# Patient Record
Sex: Male | Born: 1965
Health system: Southern US, Community
[De-identification: ages and names within clinical notes are randomized; demographics above are authoritative.]

## PROBLEM LIST (undated history)

## (undated) DIAGNOSIS — E785 Hyperlipidemia, unspecified: Secondary | ICD-10-CM

## (undated) DIAGNOSIS — I10 Essential (primary) hypertension: Secondary | ICD-10-CM

## (undated) DIAGNOSIS — R569 Unspecified convulsions: Secondary | ICD-10-CM

## (undated) HISTORY — PX: KNEE SURGERY: SHX244

## (undated) HISTORY — DX: Unspecified convulsions: R56.9

## (undated) HISTORY — DX: Hyperlipidemia, unspecified: E78.5

## (undated) HISTORY — DX: Essential (primary) hypertension: I10

---

## 2005-07-11 DIAGNOSIS — I10 Essential (primary) hypertension: Secondary | ICD-10-CM | POA: Insufficient documentation

## 2008-01-31 DIAGNOSIS — E785 Hyperlipidemia, unspecified: Secondary | ICD-10-CM | POA: Insufficient documentation

## 2010-05-08 LAB — HEPATIC FUNCTION PANEL
ALT: 14 U/L (ref 10–40)
AST: 18 U/L (ref 14–40)

## 2010-06-28 ENCOUNTER — Emergency Department: Payer: Self-pay | Admitting: Emergency Medicine

## 2011-04-14 ENCOUNTER — Emergency Department: Payer: Self-pay | Admitting: *Deleted

## 2014-03-01 LAB — LIPID PANEL
Cholesterol: 169 mg/dL (ref 0–200)
HDL: 41 mg/dL (ref 35–70)
LDL CALC: 121 mg/dL
TRIGLYCERIDES: 33 mg/dL — AB (ref 40–160)

## 2014-03-01 LAB — BASIC METABOLIC PANEL
BUN: 12 mg/dL (ref 4–21)
Creatinine: 1 mg/dL (ref 0.6–1.3)
GLUCOSE: 97 mg/dL
POTASSIUM: 4.5 mmol/L (ref 3.4–5.3)
SODIUM: 141 mmol/L (ref 137–147)

## 2014-04-14 DIAGNOSIS — G4733 Obstructive sleep apnea (adult) (pediatric): Secondary | ICD-10-CM | POA: Insufficient documentation

## 2014-04-14 DIAGNOSIS — G40909 Epilepsy, unspecified, not intractable, without status epilepticus: Secondary | ICD-10-CM | POA: Insufficient documentation

## 2015-02-01 ENCOUNTER — Other Ambulatory Visit: Payer: Self-pay | Admitting: Family Medicine

## 2015-02-01 DIAGNOSIS — I10 Essential (primary) hypertension: Secondary | ICD-10-CM

## 2015-02-01 MED ORDER — HYDROCHLOROTHIAZIDE 12.5 MG PO CAPS: 12.5000 mg | ORAL_CAPSULE | Freq: Every day | ORAL | Status: DC

## 2015-02-01 MED ORDER — LISINOPRIL 20 MG PO TABS: 20.0000 mg | ORAL_TABLET | Freq: Two times a day (BID) | ORAL | Status: DC

## 2015-08-09 ENCOUNTER — Other Ambulatory Visit: Payer: Self-pay | Admitting: Family Medicine

## 2015-08-09 DIAGNOSIS — I10 Essential (primary) hypertension: Secondary | ICD-10-CM

## 2015-08-09 MED ORDER — HYDROCHLOROTHIAZIDE 12.5 MG PO CAPS: 12.5000 mg | ORAL_CAPSULE | Freq: Every day | ORAL | Status: DC

## 2015-08-09 MED ORDER — LISINOPRIL 20 MG PO TABS: 20.0000 mg | ORAL_TABLET | Freq: Two times a day (BID) | ORAL | Status: DC

## 2016-02-04 ENCOUNTER — Other Ambulatory Visit: Payer: Self-pay | Admitting: Family Medicine

## 2016-02-04 DIAGNOSIS — I10 Essential (primary) hypertension: Secondary | ICD-10-CM

## 2016-02-04 MED ORDER — HYDROCHLOROTHIAZIDE 12.5 MG PO CAPS: 12.5000 mg | ORAL_CAPSULE | Freq: Every day | ORAL | Status: DC

## 2016-02-07 ENCOUNTER — Other Ambulatory Visit: Payer: Self-pay | Admitting: Family Medicine

## 2016-02-07 DIAGNOSIS — I10 Essential (primary) hypertension: Secondary | ICD-10-CM

## 2016-02-07 MED ORDER — LISINOPRIL 20 MG PO TABS: 20.0000 mg | ORAL_TABLET | Freq: Two times a day (BID) | ORAL | Status: DC

## 2016-02-27 ENCOUNTER — Ambulatory Visit (INDEPENDENT_AMBULATORY_CARE_PROVIDER_SITE_OTHER): Payer: BLUE CROSS/BLUE SHIELD | Admitting: Family Medicine

## 2016-02-27 ENCOUNTER — Encounter: Payer: Self-pay | Admitting: Family Medicine

## 2016-02-27 VITALS — BP 112/64 | HR 74 | Temp 97.9°F | Resp 16 | Ht 71.0 in | Wt 285.8 lb

## 2016-02-27 DIAGNOSIS — G40909 Epilepsy, unspecified, not intractable, without status epilepticus: Secondary | ICD-10-CM | POA: Diagnosis not present

## 2016-02-27 DIAGNOSIS — Z0000 Encounter for general adult medical examination without abnormal findings: Secondary | ICD-10-CM | POA: Diagnosis not present

## 2016-02-27 DIAGNOSIS — E785 Hyperlipidemia, unspecified: Secondary | ICD-10-CM | POA: Diagnosis not present

## 2016-02-27 DIAGNOSIS — Z23 Encounter for immunization: Secondary | ICD-10-CM | POA: Diagnosis not present

## 2016-02-27 DIAGNOSIS — I10 Essential (primary) hypertension: Secondary | ICD-10-CM | POA: Diagnosis not present

## 2016-02-27 NOTE — Patient Instructions (Signed)
We will call you with the lab results. Encourage walking 5 days a week for at least 30 minutes.

## 2016-02-27 NOTE — Progress Notes (Signed)
Subjective:     Patient ID: Cristian AnaBrice Willmore Jr., male   DOB: 07-Mar-1966, 50 y.o.   MRN: [id number]  HPI  Chief Complaint  Patient presents with  . Annual Exam    Patient comes in office today for his annual physical he states that he has no questions or concerns today. Patient reports that he is eating a well balanced diet and sleeping well,  actively exercising 3x a week.   States he will walk for 30-45 minutes. 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   Review of Systems General: Feeling well, due for tetanus booster. HEENT: pending eye exam for evaluation of left lateral eye lesion. Has not seen a dentist in two years but encouraged to do so. Cardiovascular: no chest pain, shortness of breath, or palpitations GI: no heartburn, no change in bowel habits or blood in the stool GU: nocturia x0- 1, no change in bladder habits  Neuro: sees neurology, Dr. Sherryll BurgerShah, annually for medication management Psychiatric: not depressed Musculoskeletal: no joint pain    Objective:   Physical Exam  Constitutional: He appears well-developed and well-nourished. No distress.  Eyes: PERRLA Neck: no thyromegaly, tenderness or nodules,  ENT: TM's intact without inflammation; No tonsillar enlargement or exudate, Lungs: Clear Heart : RRR without murmur or gallop Abd: bowel sounds present, soft, non-tender, no organomegaly Extremities: no edema, no varicosities Skin: 1 cm skin tag to right lateral eye.     Assessment:    1. Annual physical exam  2. Benign essential HTN - Comprehensive metabolic panel  3. HLD (hyperlipidemia) - Lipid panel  4. Seizure disorder (HCC)  5. Need for diphtheria-tetanus-pertussis (Tdap) vaccine - Tdap vaccine greater than or equal to 7yo IM    Plan:    Further f/u pending lab work. Encouraged increasing walking to 5 days/week.

## 2016-02-28 ENCOUNTER — Telehealth: Payer: Self-pay

## 2016-02-28 LAB — COMPREHENSIVE METABOLIC PANEL
ALK PHOS: 65 IU/L (ref 39–117)
ALT: 21 IU/L (ref 0–44)
AST: 26 IU/L (ref 0–40)
Albumin/Globulin Ratio: 1.6 (ref 1.2–2.2)
Albumin: 4.6 g/dL (ref 3.5–5.5)
BUN/Creatinine Ratio: 16 (ref 9–20)
BUN: 14 mg/dL (ref 6–24)
Bilirubin Total: 0.2 mg/dL (ref 0.0–1.2)
CHLORIDE: 97 mmol/L (ref 96–106)
CO2: 23 mmol/L (ref 18–29)
CREATININE: 0.89 mg/dL (ref 0.76–1.27)
Calcium: 9.5 mg/dL (ref 8.7–10.2)
GFR calc Af Amer: 118 mL/min/{1.73_m2} (ref >59)
GFR calc non Af Amer: 102 mL/min/{1.73_m2} (ref >59)
GLUCOSE: 107 mg/dL — AB (ref 65–99)
Globulin, Total: 2.9 g/dL (ref 1.5–4.5)
Potassium: 4.2 mmol/L (ref 3.5–5.2)
SODIUM: 138 mmol/L (ref 134–144)
Total Protein: 7.5 g/dL (ref 6.0–8.5)

## 2016-02-28 LAB — LIPID PANEL
CHOLESTEROL TOTAL: 201 mg/dL — AB (ref 100–199)
Chol/HDL Ratio: 3.9 ratio units (ref 0.0–5.0)
HDL: 51 mg/dL (ref >39)
LDL CALC: 142 mg/dL — AB (ref 0–99)
TRIGLYCERIDES: 42 mg/dL (ref 0–149)
VLDL Cholesterol Cal: 8 mg/dL (ref 5–40)

## 2016-02-28 NOTE — Telephone Encounter (Signed)
-----   Message from Anola Gurneyobert Chauvin, [place] sent at 02/28/2016  7:42 AM EDT ----- Mild sugar and cholesterol elevation. Sugar should improve with increased exercise. Your calculated 10 year risk for developing cardiovascular disease is 5.9%. This is below the 7.5% risk when we recommend a cholesterol lowering medication.

## 2016-02-28 NOTE — Telephone Encounter (Signed)
Patient has been advised of lab report. KW 

## 2016-04-29 ENCOUNTER — Other Ambulatory Visit: Payer: Self-pay

## 2016-04-29 DIAGNOSIS — I10 Essential (primary) hypertension: Secondary | ICD-10-CM

## 2016-04-29 MED ORDER — LISINOPRIL 20 MG PO TABS: 20.0000 mg | ORAL_TABLET | Freq: Two times a day (BID) | ORAL | 0 refills | Status: DC

## 2016-04-29 MED ORDER — HYDROCHLOROTHIAZIDE 12.5 MG PO CAPS: 12.5000 mg | ORAL_CAPSULE | Freq: Every day | ORAL | 0 refills | Status: DC

## 2016-04-29 NOTE — Telephone Encounter (Addendum)
Cristian Ortiz patient:: [facility] requesting HCTZ 12.5 mg and Lisinopril 20 mg- 90 day supply.

## 2016-08-05 ENCOUNTER — Other Ambulatory Visit: Payer: Self-pay | Admitting: Family Medicine

## 2016-08-05 DIAGNOSIS — I10 Essential (primary) hypertension: Secondary | ICD-10-CM

## 2017-01-28 ENCOUNTER — Other Ambulatory Visit: Payer: Self-pay | Admitting: Family Medicine

## 2017-01-28 DIAGNOSIS — I10 Essential (primary) hypertension: Secondary | ICD-10-CM

## 2017-04-23 ENCOUNTER — Other Ambulatory Visit: Payer: Self-pay | Admitting: Family Medicine

## 2017-04-23 DIAGNOSIS — I10 Essential (primary) hypertension: Secondary | ICD-10-CM

## 2017-05-05 ENCOUNTER — Other Ambulatory Visit: Payer: Self-pay | Admitting: Family Medicine

## 2017-05-05 DIAGNOSIS — I10 Essential (primary) hypertension: Secondary | ICD-10-CM

## 2017-07-30 ENCOUNTER — Other Ambulatory Visit: Payer: Self-pay | Admitting: Family Medicine

## 2017-07-30 DIAGNOSIS — I10 Essential (primary) hypertension: Secondary | ICD-10-CM

## 2017-11-02 ENCOUNTER — Other Ambulatory Visit: Payer: Self-pay | Admitting: Family Medicine

## 2017-11-02 DIAGNOSIS — I10 Essential (primary) hypertension: Secondary | ICD-10-CM

## 2018-02-01 ENCOUNTER — Other Ambulatory Visit: Payer: Self-pay | Admitting: Family Medicine

## 2018-02-01 DIAGNOSIS — I10 Essential (primary) hypertension: Secondary | ICD-10-CM

## 2018-05-06 ENCOUNTER — Other Ambulatory Visit: Payer: Self-pay | Admitting: Family Medicine

## 2018-05-06 DIAGNOSIS — I10 Essential (primary) hypertension: Secondary | ICD-10-CM

## 2018-05-06 NOTE — Telephone Encounter (Signed)
Patient is overdue for follow up visit. Medication last filled 02/02/18, last office visit 02/27/16. KW

## 2018-05-17 ENCOUNTER — Other Ambulatory Visit: Payer: Self-pay | Admitting: Family Medicine

## 2018-05-17 DIAGNOSIS — I10 Essential (primary) hypertension: Secondary | ICD-10-CM

## 2018-05-24 ENCOUNTER — Encounter: Payer: Self-pay | Admitting: Family Medicine

## 2018-05-24 ENCOUNTER — Ambulatory Visit (INDEPENDENT_AMBULATORY_CARE_PROVIDER_SITE_OTHER): Payer: Self-pay | Admitting: Family Medicine

## 2018-05-24 VITALS — BP 148/88 | HR 70 | Temp 98.7°F | Wt 293.4 lb

## 2018-05-24 DIAGNOSIS — Z23 Encounter for immunization: Secondary | ICD-10-CM

## 2018-05-24 DIAGNOSIS — G40909 Epilepsy, unspecified, not intractable, without status epilepticus: Secondary | ICD-10-CM

## 2018-05-24 DIAGNOSIS — I10 Essential (primary) hypertension: Secondary | ICD-10-CM

## 2018-05-24 DIAGNOSIS — E782 Mixed hyperlipidemia: Secondary | ICD-10-CM

## 2018-05-24 DIAGNOSIS — Z0000 Encounter for general adult medical examination without abnormal findings: Secondary | ICD-10-CM

## 2018-05-24 MED ORDER — HYDROCHLOROTHIAZIDE 12.5 MG PO CAPS: 12.5000 mg | ORAL_CAPSULE | Freq: Every day | ORAL | 3 refills | Status: DC

## 2018-05-24 MED ORDER — LISINOPRIL 20 MG PO TABS: 20.0000 mg | ORAL_TABLET | Freq: Two times a day (BID) | ORAL | 3 refills | Status: DC

## 2018-05-24 NOTE — Progress Notes (Signed)
  Subjective:     Patient ID: Cristian Cal., male   DOB: [birth year], 52 y.o.   MRN: [id number] Chief Complaint  Patient presents with  . Annual Exam    Patient presents today for his annual exam. Patient states he is feeling and sleeping well. Patient states he has not had any seizures lately.   Marland Kitchen HPI Followed up with his neurologist, Dr. Manuella Ghazi, earlier in the year and had Met C performed. 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 Reports he did not take his bp medication this AM. States he walks 30 minutes daily. Currently has no health insurance. Defers colonoscopy to 2020.  Review of Systems General: Feeling well; Wishes flu vaccine today. HEENT:  No regular dental visits but intends to upate. Uses otc readers but intends to update his eye exam. Denies changes in hearing. Cardiovascular: no chest pain, shortness of breath, or palpitations GI: no heartburn, no change in bowel habits or blood in the stool GU: nocturia x 1, no change in bladder habits  Psychiatric: not depressed Musculoskeletal: no joint pain    Objective:   Physical Exam  Constitutional: He appears well-developed and well-nourished. No distress.  Eyes: PERRLA, EOMI Neck: no thyromegaly, tenderness or nodules, no cervical adenopathy or carotid bruits ENT: Ear canals with excessive cerumen and TM"s not visualized.  No tonsillar enlargement or exudate, Lungs: Clear Heart : RRR without murmur or gallop Abd: bowel sounds present, soft, non-tender, no organomegaly Extremities: no edema     Assessment:    1. Annual physical exam - PSA  2. Mixed hyperlipidemia - Lipid panel  3. Seizure disorder [facility]): per neuro for nocturnal seizures  4. Need for influenza vaccination  5. Essential hypertension - hydrochlorothiazide (MICROZIDE) 12.5 MG capsule; Take 1 capsule (12.5 mg total) by mouth daily.  Dispense: 90 capsule; Refill: 3 - lisinopril (PRINIVIL,ZESTRIL) 20 MG tablet; Take 1 tablet (20 mg total) by mouth 2 (two)  times daily.  Dispense: 180 tablet; Refill: 3    Plan:    Further f/u pending lab work. Encouraged daily walking program and updating dental and eye exam. He will call next year for colonoscopy referral.

## 2018-05-24 NOTE — Patient Instructions (Addendum)
We will call you with the lab results. Continue walking at least 30 minutes daily. Call me when you are ready for me to set up the colonoscopy referral. Think about updating your dentist and eye doctor visits.

## 2018-05-24 NOTE — Addendum Note (Signed)
Addended by: Jeanene Erb on: 05/24/2018 09:49 AM   Modules accepted: Orders

## 2018-05-25 ENCOUNTER — Telehealth: Payer: Self-pay

## 2018-05-25 LAB — LIPID PANEL
Chol/HDL Ratio: 4 ratio (ref 0.0–5.0)
Cholesterol, Total: 189 mg/dL (ref 100–199)
HDL: 47 mg/dL (ref >39)
LDL Calculated: 133 mg/dL — ABNORMAL HIGH (ref 0–99)
TRIGLYCERIDES: 43 mg/dL (ref 0–149)
VLDL CHOLESTEROL CAL: 9 mg/dL (ref 5–40)

## 2018-05-25 LAB — PSA: PROSTATE SPECIFIC AG, SERUM: 0.4 ng/mL (ref 0.0–4.0)

## 2018-05-25 NOTE — Telephone Encounter (Signed)
-----   Message from Anola Gurney, [place] sent at 05/25/2018  7:17 AM EST ----- Labs ok. Cholesterol improved since 2017.

## 2018-05-25 NOTE — Telephone Encounter (Signed)
Patient advised.KW 

## 2018-06-16 ENCOUNTER — Telehealth: Payer: Self-pay | Admitting: Family Medicine

## 2018-06-16 NOTE — Telephone Encounter (Signed)
Pt is requesting to pick up copy of immunization record for his employer. Please advise. Thanks TNP

## 2018-06-16 NOTE — Telephone Encounter (Signed)
Patient advised report at front for pick up. KW

## 2018-11-24 MED FILL — PHENYTOIN SOD EXT 200 MG CA: 200 | 90 days supply | Qty: 180 | Fill #0

## 2018-12-15 MED FILL — HYDROCHLOROTHIAZIDE 12.5 MG: 12.5 | 90 days supply | Qty: 90 | Fill #0

## 2018-12-15 MED FILL — LISINOPRIL 20 MG TABLET: 20 | 90 days supply | Qty: 180 | Fill #0

## 2019-02-23 MED FILL — PHENYTOIN SOD EXT 200 MG CA: 200 | 90 days supply | Qty: 180 | Fill #1

## 2019-02-23 MED FILL — TOPIRAMATE 100 MG TABLET: 100 | 90 days supply | Qty: 180 | Fill #0

## 2019-03-18 ENCOUNTER — Other Ambulatory Visit: Payer: Self-pay | Admitting: Physician Assistant

## 2019-03-18 DIAGNOSIS — I10 Essential (primary) hypertension: Secondary | ICD-10-CM

## 2019-03-18 MED FILL — HYDROCHLOROTHIAZIDE 12.5 MG: 12.5 | 60 days supply | Qty: 60 | Fill #1

## 2019-03-18 NOTE — Telephone Encounter (Addendum)
[facility] faxed refill request for the following medications:  1. hydrochlorothiazide (MICROZIDE) 12.5 MG capsule  2. lisinopril (PRINIVIL,ZESTRIL) 20 MG tablet  90 day supply  LOV: 05/24/2018 with Mikki Santee. Pt currently doesn't have an appt to est care with another provider in the office. Please advise. Thanks TNP

## 2019-03-21 ENCOUNTER — Other Ambulatory Visit: Payer: Self-pay

## 2019-03-21 DIAGNOSIS — I10 Essential (primary) hypertension: Secondary | ICD-10-CM

## 2019-03-21 MED ORDER — HYDROCHLOROTHIAZIDE 12.5 MG PO CAPS: 12.5000 mg | ORAL_CAPSULE | Freq: Every day | ORAL | 0 refills | Status: DC

## 2019-03-21 MED ORDER — LISINOPRIL 20 MG PO TABS: 20.0000 mg | ORAL_TABLET | Freq: Two times a day (BID) | ORAL | 0 refills | Status: DC

## 2019-03-21 MED FILL — LISINOPRIL 20 MG TABLET: 20 | 90 days supply | Qty: 180 | Fill #0

## 2019-03-21 NOTE — Telephone Encounter (Signed)
Patient scheduled an appointment for next week. He only has 3 days worth of medication left. Please advise

## 2019-03-21 NOTE — Telephone Encounter (Signed)
Called patient to schedule appointment with another provider in the office but no answer. [place] so can schedule appointment to be seen in office.Please advised.

## 2019-03-29 NOTE — Telephone Encounter (Signed)
Patient has appointment schedule for 04/01/2019.

## 2019-04-01 ENCOUNTER — Other Ambulatory Visit: Payer: Self-pay

## 2019-04-01 ENCOUNTER — Ambulatory Visit (INDEPENDENT_AMBULATORY_CARE_PROVIDER_SITE_OTHER): Payer: No Typology Code available for payment source | Admitting: Physician Assistant

## 2019-04-01 ENCOUNTER — Encounter: Payer: Self-pay | Admitting: Physician Assistant

## 2019-04-01 VITALS — BP 112/78 | HR 68 | Temp 97.1°F | Resp 16 | Ht 71.0 in | Wt 281.6 lb

## 2019-04-01 DIAGNOSIS — Z23 Encounter for immunization: Secondary | ICD-10-CM

## 2019-04-01 DIAGNOSIS — Z1211 Encounter for screening for malignant neoplasm of colon: Secondary | ICD-10-CM | POA: Diagnosis not present

## 2019-04-01 DIAGNOSIS — I10 Essential (primary) hypertension: Secondary | ICD-10-CM | POA: Diagnosis not present

## 2019-04-01 DIAGNOSIS — Z125 Encounter for screening for malignant neoplasm of prostate: Secondary | ICD-10-CM

## 2019-04-01 DIAGNOSIS — G40909 Epilepsy, unspecified, not intractable, without status epilepticus: Secondary | ICD-10-CM

## 2019-04-01 DIAGNOSIS — Z114 Encounter for screening for human immunodeficiency virus [HIV]: Secondary | ICD-10-CM

## 2019-04-01 NOTE — Progress Notes (Signed)
Patient: Cristian AnaBrice Saad Jr. Male    DOB: 12-04-1965   53 y.o.   MRN: [id number] Visit Date: 04/01/2019  Today's Provider: Trey SailorsAdriana M Alaysia Lightle, PA-C   Chief Complaint  Patient presents with  . Hypertension   Subjective:    Living In [place] with wife of 13 years. No children. Two cats. 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   [facility].   HPI  Hypertension, follow-up:  BP Readings from Last 3 Encounters:  04/01/19 112/78  05/24/18 (!) 148/88  02/27/16 112/64    He was last seen for hypertension 10 months ago.  BP at that visit was 148/88. Management changes since that visit include no changes. He reports excellent compliance with treatment. He is not having side effects. He is exercising. He is adherent to low sat diet.   Outside blood pressures are stable. He is experiencing none.  Patient denies chest pain and lower extremity edema.   Cardiovascular risk factors include hypertension and obesity (BMI >= 30 kg/m2).  Use of agents associated with hypertension: none.     Weight trend: stable Wt Readings from Last 3 Encounters:  04/01/19 281 lb 9.6 oz (127.7 kg)  05/24/18 293 lb 6.4 oz (133.1 kg)  02/27/16 285 lb 12.8 oz (129.6 kg)    Current diet: not asked  Colon Cancer Screening: never before, no family history.  ------------------------------------------------------------------------   No Known Allergies   Current Outpatient Medications:  .  folic acid (FOLVITE) 1 MG tablet, Take by mouth., Disp: , Rfl:  .  hydrochlorothiazide (MICROZIDE) 12.5 MG capsule, Take 1 capsule (12.5 mg total) by mouth daily., Disp: 90 capsule, Rfl: 0 .  lisinopril (ZESTRIL) 20 MG tablet, Take 1 tablet (20 mg total) by mouth 2 (two) times daily., Disp: 180 tablet, Rfl: 0 .  LORazepam (ATIVAN) 2 MG tablet, Take 2 mg by mouth daily as needed for anxiety or seizure., Disp: , Rfl:  .  phenytoin  (DILANTIN) 200 MG ER capsule, Take 200 mg by mouth 2 (two) times daily., Disp: , Rfl:  .  topiramate (TOPAMAX) 100 MG tablet, Take 100 mg by mouth., Disp: , Rfl:   Review of Systems  Social History   Tobacco Use  . Smoking status: Never Smoker  . Smokeless tobacco: Never Used  Substance Use Topics  . Alcohol use: No      Objective:   BP 112/78 (BP Location: Left Arm, Patient Position: Sitting, Cuff Size: Normal)   Pulse 68   Temp (!) 97.1 F (36.2 C) (Temporal)   Resp 16   Ht 5\' 11"  (1.803 m)   Wt 281 lb 9.6 oz (127.7 kg)   SpO2 97%   BMI 39.28 kg/m  Vitals:   04/01/19 1329  BP: 112/78  Pulse: 68  Resp: 16  Temp: (!) 97.1 F (36.2 C)  TempSrc: Temporal  SpO2: 97%  Weight: 281 lb 9.6 oz (127.7 kg)  Height: 5\' 11"  (1.803 m)  Body mass index is 39.28 kg/m.   Physical Exam Constitutional:      Appearance: Normal appearance.  Cardiovascular:     Rate and Rhythm: Normal rate and regular rhythm.     Heart sounds: Normal heart sounds.  Pulmonary:     Effort: Pulmonary effort is normal.     Breath sounds: Normal breath sounds.  Skin:    General: Skin is warm and dry.  Neurological:  Mental Status: He is alert and oriented to person, place, and time. Mental status is at baseline.  Psychiatric:        Mood and Affect: Mood normal.        Behavior: Behavior normal.      No results found for any visits on 04/01/19.     Assessment & Plan    1. Benign essential HTN  Well controlled, continue current medications.  - Comprehensive Metabolic Panel (CMET) - Lipid Profile - CBC with Differential  2. Need for influenza vaccination  - Flu Vaccine QUAD 36+ mos IM  3. Colon cancer screening   4. Prostate cancer screening  - PSA  5. Encounter for screening for HIV  - HIV antibody (with reflex)  The entirety of the information documented in the History of Present Illness, Review of Systems and Physical Exam were personally obtained by me. Portions of  this information were initially documented by Lynford Humphrey, CMA and reviewed by me for thoroughness and accuracy.       Trinna Post, PA-C  [facility]

## 2019-04-01 NOTE — Patient Instructions (Signed)

## 2019-04-02 LAB — CBC WITH DIFFERENTIAL/PLATELET
Basophils Absolute: 0.1 10*3/uL (ref 0.0–0.2)
Basos: 1 %
EOS (ABSOLUTE): 0.4 10*3/uL (ref 0.0–0.4)
Eos: 4 %
Hematocrit: 41.6 % (ref 37.5–51.0)
Hemoglobin: 14.3 g/dL (ref 13.0–17.7)
Immature Grans (Abs): 0 10*3/uL (ref 0.0–0.1)
Immature Granulocytes: 0 %
Lymphocytes Absolute: 1.4 10*3/uL (ref 0.7–3.1)
Lymphs: 14 %
MCH: 29.5 pg (ref 26.6–33.0)
MCHC: 34.4 g/dL (ref 31.5–35.7)
MCV: 86 fL (ref 79–97)
Monocytes Absolute: 0.6 10*3/uL (ref 0.1–0.9)
Monocytes: 7 %
Neutrophils Absolute: 7 10*3/uL (ref 1.4–7.0)
Neutrophils: 74 %
Platelets: 319 10*3/uL (ref 150–450)
RBC: 4.84 x10E6/uL (ref 4.14–5.80)
RDW: 12.5 % (ref 11.6–15.4)
WBC: 9.4 10*3/uL (ref 3.4–10.8)

## 2019-04-02 LAB — PSA: Prostate Specific Ag, Serum: 0.5 ng/mL (ref 0.0–4.0)

## 2019-04-02 LAB — COMPREHENSIVE METABOLIC PANEL
ALT: 21 IU/L (ref 0–44)
AST: 28 IU/L (ref 0–40)
Albumin/Globulin Ratio: 1.7 (ref 1.2–2.2)
Albumin: 4.5 g/dL (ref 4.0–5.0)
Alkaline Phosphatase: 93 IU/L (ref 39–117)
BUN/Creatinine Ratio: 13 (ref 9–20)
BUN: 11 mg/dL (ref 6–24)
Bilirubin Total: 0.2 mg/dL (ref 0.0–1.2)
CO2: 22 mmol/L (ref 20–29)
Calcium: 9.1 mg/dL (ref 8.7–10.2)
Chloride: 103 mmol/L (ref 96–106)
Creatinine, Ser: 0.85 mg/dL (ref 0.76–1.27)
GFR calc Af Amer: 117 mL/min/{1.73_m2} (ref >59)
GFR calc non Af Amer: 102 mL/min/{1.73_m2} (ref >59)
Globulin, Total: 2.7 g/dL (ref 1.5–4.5)
Glucose: 84 mg/dL (ref 65–99)
Potassium: 4 mmol/L (ref 3.5–5.2)
Sodium: 137 mmol/L (ref 134–144)
Total Protein: 7.2 g/dL (ref 6.0–8.5)

## 2019-04-02 LAB — LIPID PANEL
Chol/HDL Ratio: 3.6 ratio (ref 0.0–5.0)
Cholesterol, Total: 185 mg/dL (ref 100–199)
HDL: 52 mg/dL (ref >39)
LDL Chol Calc (NIH): 125 mg/dL — ABNORMAL HIGH (ref 0–99)
Triglycerides: 41 mg/dL (ref 0–149)
VLDL Cholesterol Cal: 8 mg/dL (ref 5–40)

## 2019-04-02 LAB — HIV ANTIBODY (ROUTINE TESTING W REFLEX): HIV Screen 4th Generation wRfx: NONREACTIVE

## 2019-05-21 ENCOUNTER — Other Ambulatory Visit: Payer: Self-pay | Admitting: Physician Assistant

## 2019-05-21 DIAGNOSIS — I10 Essential (primary) hypertension: Secondary | ICD-10-CM

## 2019-05-21 MED FILL — TOPIRAMATE 100 MG TABLET: 100 | 90 days supply | Qty: 180 | Fill #1

## 2019-05-21 MED FILL — PHENYTOIN SOD EXT 200 MG CA: 200 | 90 days supply | Qty: 180 | Fill #2

## 2019-05-21 MED FILL — HYDROCHLOROTHIAZIDE 12.5 MG: 12.5 | 90 days supply | Qty: 90 | Fill #0

## 2019-05-23 NOTE — Telephone Encounter (Signed)
L.O.V. was on 04/01/2019.

## 2019-06-21 MED FILL — LISINOPRIL 20 MG TABLET: 20 | 90 days supply | Qty: 180 | Fill #0

## 2019-08-22 ENCOUNTER — Other Ambulatory Visit: Payer: Self-pay | Admitting: Physician Assistant

## 2019-08-22 DIAGNOSIS — I10 Essential (primary) hypertension: Secondary | ICD-10-CM

## 2019-08-22 MED FILL — PHENYTOIN SOD EXT 200 MG CA: 200 | 90 days supply | Qty: 180 | Fill #3

## 2019-08-22 MED FILL — TOPIRAMATE 100 MG TABLET: 100 | 90 days supply | Qty: 180 | Fill #2

## 2019-08-23 ENCOUNTER — Other Ambulatory Visit: Payer: Self-pay | Admitting: Physician Assistant

## 2019-08-23 DIAGNOSIS — I10 Essential (primary) hypertension: Secondary | ICD-10-CM

## 2019-08-23 MED FILL — HYDROCHLOROTHIAZIDE 12.5 MG: 12.5 | 90 days supply | Qty: 90 | Fill #0

## 2019-09-19 ENCOUNTER — Other Ambulatory Visit: Payer: Self-pay | Admitting: Physician Assistant

## 2019-09-19 DIAGNOSIS — I10 Essential (primary) hypertension: Secondary | ICD-10-CM

## 2019-09-19 MED FILL — LISINOPRIL 20 MG TABLET: 20 | 30 days supply | Qty: 60 | Fill #0

## 2019-10-25 ENCOUNTER — Other Ambulatory Visit: Payer: Self-pay

## 2019-10-25 ENCOUNTER — Ambulatory Visit (INDEPENDENT_AMBULATORY_CARE_PROVIDER_SITE_OTHER): Payer: No Typology Code available for payment source | Admitting: Physician Assistant

## 2019-10-25 ENCOUNTER — Encounter: Payer: Self-pay | Admitting: Physician Assistant

## 2019-10-25 VITALS — BP 135/84 | HR 75 | Temp 95.3°F | Wt 294.6 lb

## 2019-10-25 DIAGNOSIS — Z1211 Encounter for screening for malignant neoplasm of colon: Secondary | ICD-10-CM

## 2019-10-25 DIAGNOSIS — G40909 Epilepsy, unspecified, not intractable, without status epilepticus: Secondary | ICD-10-CM

## 2019-10-25 DIAGNOSIS — E782 Mixed hyperlipidemia: Secondary | ICD-10-CM | POA: Diagnosis not present

## 2019-10-25 DIAGNOSIS — I10 Essential (primary) hypertension: Secondary | ICD-10-CM

## 2019-10-25 MED ORDER — HYDROCHLOROTHIAZIDE 12.5 MG PO CAPS: 12.5000 mg | ORAL_CAPSULE | Freq: Every day | ORAL | 1 refills | Status: DC

## 2019-10-25 MED ORDER — LISINOPRIL 20 MG PO TABS: 20.0000 mg | ORAL_TABLET | Freq: Two times a day (BID) | ORAL | 1 refills | Status: DC

## 2019-10-25 MED FILL — LISINOPRIL 20 MG TABLET: 20 | 90 days supply | Qty: 180 | Fill #0

## 2019-10-25 NOTE — Progress Notes (Signed)
Patient: Cristian Ortiz. Male    DOB: 1966-02-07   54 y.o.   MRN: [id number] Visit Date: 10/25/2019  Today's Provider: Trinna Post, PA-C   Chief Complaint  Patient presents with  . Hypertension   Subjective:     HPI  Hypertension, follow-up:  BP Readings from Last 3 Encounters:  10/25/19 135/84  04/01/19 112/78  05/24/18 (!) 148/88    He was last seen for hypertension 7 months ago.  BP at that visit was 112/78. Management changes since that visit include none. Continues HCTZ 12.5 mg QD and lisinopril 20 mg QD>  He reports good compliance with treatment. He is not having side effects.  He is exercising. He is adherent to low salt diet.   Outside blood pressures are normal. He is experiencing none.  Patient denies chest pain, chest pressure/discomfort, exertional chest pressure/discomfort, fatigue, irregular heart beat, lower extremity edema and palpitations.   Cardiovascular risk factors include hypertension, male gender and obesity (BMI >= 30 kg/m2).  Use of agents associated with hypertension: none.     Weight trend: increasing steadily Wt Readings from Last 3 Encounters:  10/25/19 294 lb 9.6 oz (133.6 kg)  04/01/19 281 lb 9.6 oz (127.7 kg)  05/24/18 293 lb 6.4 oz (133.1 kg)    Current diet: in general, a "healthy" diet    ------------------------------------------------------------------------   No Known Allergies   Current Outpatient Medications:  .  folic acid (FOLVITE) 1 MG tablet, Take by mouth., Disp: , Rfl:  .  hydrochlorothiazide (MICROZIDE) 12.5 MG capsule, TAKE 1 CAPSULE BY MOUTH ONCE DAILY, Disp: 90 capsule, Rfl: 0 .  lisinopril (ZESTRIL) 20 MG tablet, TAKE 1 TABLET BY MOUTH TWICE DAILY, Disp: 60 tablet, Rfl: 0 .  LORazepam (ATIVAN) 2 MG tablet, Take 2 mg by mouth daily as needed for anxiety or seizure., Disp: , Rfl:  .  phenytoin (DILANTIN) 200 MG ER capsule, Take 200 mg by mouth 2 (two) times daily., Disp: , Rfl:  .  topiramate  (TOPAMAX) 100 MG tablet, Take 100 mg by mouth., Disp: , Rfl:   Review of Systems  Constitutional: Negative.   Respiratory: Negative.   Cardiovascular: Negative.   Hematological: Negative.     Social History   Tobacco Use  . Smoking status: Never Smoker  . Smokeless tobacco: Never Used  Substance Use Topics  . Alcohol use: No      Objective:   BP 135/84 (BP Location: Right Arm, Patient Position: Sitting, Cuff Size: Normal)   Pulse 75   Temp (!) 95.3 F (35.2 C) (Temporal)   Wt 294 lb 9.6 oz (133.6 kg)   BMI 41.09 kg/m  Vitals:   10/25/19 0954  BP: 135/84  Pulse: 75  Temp: (!) 95.3 F (35.2 C)  TempSrc: Temporal  Weight: 294 lb 9.6 oz (133.6 kg)  Body mass index is 41.09 kg/m.   Physical Exam Constitutional:      Appearance: Normal appearance.  Cardiovascular:     Rate and Rhythm: Normal rate and regular rhythm.     Heart sounds: Normal heart sounds.  Pulmonary:     Effort: Pulmonary effort is normal.     Breath sounds: Normal breath sounds.  Skin:    General: Skin is warm and dry.  Neurological:     Mental Status: He is alert and oriented to person, place, and time. Mental status is at baseline.  Psychiatric:        Mood and Affect: Mood normal.  Behavior: Behavior normal.      No results found for any visits on 10/25/19.     Assessment & Plan    1. Mixed hyperlipidemia  Monitor for now.    2. Benign essential HTN  Continue medications as prescribed. Follow up in 6 months for CPE and f/u. If BP normal at that point, will follow up in one year.   3. Seizure disorder (HCC)  Followed by neurology.  4. Colon cancer screening  - Cologuard  5. Essential hypertension  - lisinopril (ZESTRIL) 20 MG tablet; Take 1 tablet (20 mg total) by mouth 2 (two) times daily.  Dispense: 180 tablet; Refill: 1 - hydrochlorothiazide (MICROZIDE) 12.5 MG capsule; Take 1 capsule (12.5 mg total) by mouth daily.  Dispense: 90 capsule; Refill: 1  The  entirety of the information documented in the History of Present Illness, Review of Systems and Physical Exam were personally obtained by me. Portions of this information were initially documented by [facility] and reviewed by me for thoroughness and accuracy.      Trey Sailors, PA-C  [facility]

## 2019-10-25 NOTE — Patient Instructions (Signed)

## 2019-10-27 LAB — COLOGUARD: Cologuard: NEGATIVE

## 2019-11-01 ENCOUNTER — Telehealth: Payer: Self-pay

## 2019-11-01 DIAGNOSIS — E669 Obesity, unspecified: Secondary | ICD-10-CM | POA: Insufficient documentation

## 2019-11-01 DIAGNOSIS — N62 Hypertrophy of breast: Secondary | ICD-10-CM | POA: Insufficient documentation

## 2019-11-01 DIAGNOSIS — I10 Essential (primary) hypertension: Secondary | ICD-10-CM | POA: Insufficient documentation

## 2019-11-01 DIAGNOSIS — J45909 Unspecified asthma, uncomplicated: Secondary | ICD-10-CM | POA: Insufficient documentation

## 2019-11-01 DIAGNOSIS — L819 Disorder of pigmentation, unspecified: Secondary | ICD-10-CM | POA: Insufficient documentation

## 2019-11-01 LAB — EXTERNAL GENERIC LAB PROCEDURE: COLOGUARD: NEGATIVE

## 2019-11-01 LAB — COLOGUARD: COLOGUARD: NEGATIVE

## 2019-11-01 NOTE — Telephone Encounter (Signed)
Called and LVM for patient about negative cologuard.

## 2019-11-21 MED FILL — TOPIRAMATE 100 MG TABLET: 100 | 90 days supply | Qty: 180 | Fill #0

## 2019-11-21 MED FILL — PHENYTOIN SOD EXT 200 MG CA: 200 | 90 days supply | Qty: 180 | Fill #0

## 2019-11-21 MED FILL — HYDROCHLOROTHIAZIDE 12.5 MG: 12.5 | 90 days supply | Qty: 90 | Fill #0

## 2020-01-17 MED FILL — LISINOPRIL 20 MG TABLET: 20 | 90 days supply | Qty: 180 | Fill #1

## 2020-02-13 MED FILL — HYDROCHLOROTHIAZIDE 12.5 MG: 12.5 | 90 days supply | Qty: 90 | Fill #1

## 2020-02-14 ENCOUNTER — Ambulatory Visit: Payer: Self-pay | Attending: Internal Medicine

## 2020-02-14 DIAGNOSIS — Z23 Encounter for immunization: Secondary | ICD-10-CM

## 2020-02-14 NOTE — Progress Notes (Signed)
   [facility]  Name:  Cristian Ortiz.    MRN: [id number] DOB: [birth year]  02/14/2020  [name] was observed post Covid-19 immunization for 15 minutes without incident. He was provided with Vaccine Information Sheet and instruction to access the V-Safe system.   [name] was instructed to call 911 with any severe reactions post vaccine: Marland Kitchen Difficulty breathing  . Swelling of face and throat  . A fast heartbeat  . A bad rash all over body  . Dizziness and weakness   Immunizations Administered    Name Date Dose VIS Date Route   JANSSEN COVID-19 VACCINE 02/14/2020  2:19 PM 0.5 mL 09/17/2019 Intramuscular   Manufacturer: [employer]wood Dibbles   Lot: [id number]   NDC: 37902-409-73

## 2020-02-22 MED FILL — PHENYTOIN SOD EXT 200 MG CA: 200 | 90 days supply | Qty: 180 | Fill #0

## 2020-02-22 MED FILL — TOPIRAMATE 100 MG TABLET: 100 | 90 days supply | Qty: 180 | Fill #0

## 2020-03-01 ENCOUNTER — Other Ambulatory Visit (HOSPITAL_COMMUNITY): Payer: Self-pay | Admitting: Neurology

## 2020-03-02 MED FILL — LORazepam 2 MG TABS: 2 | 15 days supply | Qty: 15 | Fill #0

## 2020-04-16 ENCOUNTER — Other Ambulatory Visit: Payer: Self-pay | Admitting: Physician Assistant

## 2020-04-16 DIAGNOSIS — I10 Essential (primary) hypertension: Secondary | ICD-10-CM

## 2020-04-16 MED FILL — LISINOPRIL 20 MG TABLET: 20 | 30 days supply | Qty: 60 | Fill #0

## 2020-04-16 NOTE — Telephone Encounter (Signed)
Has appointment scheduled- 30 day RF granted

## 2020-04-25 ENCOUNTER — Other Ambulatory Visit: Payer: Self-pay

## 2020-04-25 ENCOUNTER — Encounter: Payer: Self-pay | Admitting: Physician Assistant

## 2020-04-25 ENCOUNTER — Ambulatory Visit (INDEPENDENT_AMBULATORY_CARE_PROVIDER_SITE_OTHER): Payer: No Typology Code available for payment source | Admitting: Physician Assistant

## 2020-04-25 ENCOUNTER — Other Ambulatory Visit: Payer: Self-pay | Admitting: Physician Assistant

## 2020-04-25 VITALS — BP 122/76 | HR 78 | Temp 98.8°F | Resp 16 | Ht 72.0 in | Wt 282.0 lb

## 2020-04-25 DIAGNOSIS — Z0000 Encounter for general adult medical examination without abnormal findings: Secondary | ICD-10-CM | POA: Diagnosis not present

## 2020-04-25 DIAGNOSIS — Z1159 Encounter for screening for other viral diseases: Secondary | ICD-10-CM

## 2020-04-25 NOTE — Progress Notes (Signed)
Complete physical exam   Patient: Cristian Ortiz.   DOB: [birth year]   53 y.o. Male  MRN: [id number] Visit Date: 04/25/2020  Today's healthcare provider: Trey Sailors, PA-C   Chief Complaint  Patient presents with  . Annual Exam   Subjective    Cristian Corradi. is a 54 y.o. male who presents today for a complete physical exam.  He reports consuming a general diet. The patient does not participate in regular exercise at present. He generally feels fairly well. He reports sleeping well. He does not have additional problems to discuss today.    Cologuard completed 10/27/2019, negative.   Past Medical History:  Diagnosis Date  . Hyperlipidemia   . Hypertension   . Seizures (HCC)    Past Surgical History:  Procedure Laterality Date  . KNEE SURGERY     Social History   Socioeconomic History  . Marital status: Married    Spouse name: Not on file  . Number of children: Not on file  . Years of education: Not on file  . Highest education level: Not on file  Occupational History  . Not on file  Tobacco Use  . Smoking status: Never Smoker  . Smokeless tobacco: Never Used  Substance and Sexual Activity  . Alcohol use: No  . Drug use: No  . Sexual activity: Yes    Partners: Female  Other Topics Concern  . Not on file  Social History Narrative  . Not on file   Social Determinants of Health   Financial Resource Strain:   . Difficulty of Paying Living Expenses: Not on file  Food Insecurity:   . Worried About Programme researcher, broadcasting/film/video in the Last Year: Not on file  . Ran Out of Food in the Last Year: Not on file  Transportation Needs:   . Lack of Transportation (Medical): Not on file  . Lack of Transportation (Non-Medical): Not on file  Physical Activity:   . Days of Exercise per Week: Not on file  . Minutes of Exercise per Session: Not on file  Stress:   . Feeling of Stress : Not on file  Social Connections:   . Frequency of Communication with Friends and  Family: Not on file  . Frequency of Social Gatherings with Friends and Family: Not on file  . Attends Religious Services: Not on file  . Active Member of Clubs or Organizations: Not on file  . Attends Banker Meetings: Not on file  . Marital Status: Not on file  Intimate Partner Violence:   . Fear of Current or Ex-Partner: Not on file  . Emotionally Abused: Not on file  . Physically Abused: Not on file  . Sexually Abused: Not on file   Family Status  Relation Name Status  . Mother  Alive  . Father  Alive  . Sister  Alive  . Brother  Alive  . Brother  Alive   Family History  Problem Relation Age of Onset  . Hypertension Mother   . Diabetes Father   . Cataracts Father   . Hypertension Sister   . Hypertension Brother   . Hypertension Brother   . Congestive Heart Failure Brother    No Known Allergies  Patient Care Team: Maryella Shivers as PCP - General (Physician Assistant)   Medications: Outpatient Medications Prior to Visit  Medication Sig  . Coenzyme Q10 10 MG capsule Take by mouth.  . folic acid (FOLVITE) 1 MG tablet  Take by mouth.  . hydrochlorothiazide (MICROZIDE) 12.5 MG capsule Take 1 capsule (12.5 mg total) by mouth daily.  Marland Kitchen lisinopril (ZESTRIL) 20 MG tablet TAKE 1 TABLET BY MOUTH TWO TIMES DAILY (NEEDS OFFICE VISIT)  . LORazepam (ATIVAN) 2 MG tablet Take 2 mg by mouth daily as needed for anxiety or seizure.  . phenytoin (DILANTIN) 200 MG ER capsule Take 200 mg by mouth 2 (two) times daily.  Marland Kitchen topiramate (TOPAMAX) 100 MG tablet Take 100 mg by mouth.   No facility-administered medications prior to visit.    Review of Systems  Constitutional: Negative.   HENT: Negative.   Eyes: Negative.   Respiratory: Negative.   Cardiovascular: Negative.   Gastrointestinal: Negative.   Endocrine: Negative.   Genitourinary: Negative.   Musculoskeletal: Negative.   Skin: Negative.   Allergic/Immunologic: Negative.   Neurological: Negative.    Hematological: Negative.   Psychiatric/Behavioral: Negative.       Objective    BP 122/76   Pulse 78   Temp 98.8 F (37.1 C)   Resp 16   Ht 6' (1.829 m)   Wt 282 lb (127.9 kg)   BMI 38.25 kg/m    Physical Exam Constitutional:      Appearance: He is obese.  HENT:     Right Ear: Tympanic membrane normal.     Left Ear: Tympanic membrane normal.  Eyes:     Pupils: Pupils are equal, round, and reactive to light.  Cardiovascular:     Rate and Rhythm: Normal rate and regular rhythm.     Pulses: Normal pulses.  Pulmonary:     Effort: Pulmonary effort is normal.     Breath sounds: Normal breath sounds.  Abdominal:     General: Bowel sounds are normal.  Musculoskeletal:        General: Normal range of motion.  Skin:    General: Skin is warm and dry.  Neurological:     Mental Status: He is alert and oriented to person, place, and time. Mental status is at baseline.  Psychiatric:        Mood and Affect: Mood normal.        Behavior: Behavior normal.    Last depression screening scores PHQ 2/9 Scores 04/25/2020 04/25/2020 10/25/2019  PHQ - 2 Score 0 0 0   Last fall risk screening Fall Risk  04/25/2020  Falls in the past year? 0  Number falls in past yr: 0  Injury with Fall? 0  Risk for fall due to : No Fall Risks  Follow up Falls evaluation completed   Last Audit-C alcohol use screening Alcohol Use Disorder Test (AUDIT) 04/25/2020  1. How often do you have a drink containing alcohol? 0  2. How many drinks containing alcohol do you have on a typical day when you are drinking? 0  3. How often do you have six or more drinks on one occasion? 0  AUDIT-C Score 0  Alcohol Brief Interventions/Follow-up AUDIT Score <7 follow-up not indicated   A score of 3 or more in women, and 4 or more in men indicates increased risk for alcohol abuse, EXCEPT if all of the points are from question 1   No results found for any visits on 04/25/20.  Assessment & Plan    Routine Health  Maintenance and Physical Exam  Exercise Activities and Dietary recommendations Goals   None     Immunization History  Administered Date(s) Administered  . Influenza,inj,Quad PF,6+ Mos 05/24/2018, 04/01/2019, 04/18/2020  . Linwood Dibbles (  [employer]) SARS-COV-2 Vaccination 02/14/2020  . Tdap 02/27/2016    Health Maintenance  Topic Date Due  . Hepatitis C Screening  Never done  . Fecal DNA (Cologuard)  10/27/2022  . TETANUS/TDAP  02/26/2026  . INFLUENZA VACCINE  Completed  . COVID-19 Vaccine  Completed  . HIV Screening  Completed    Discussed health benefits of physical activity, and encouraged him to engage in regular exercise appropriate for his age and condition.  1. Annual physical exam  - TSH - Lipid panel - Comprehensive metabolic panel - CBC with Differential/Platelet - PSA  2. Encounter for hepatitis C screening test for low risk patient  - Hepatitis C Antibody    Return in about 6 months (around 10/24/2020) for chronic, HTN.     I, Trey Sailors, PA-C, have reviewed all documentation for this visit. The documentation on 04/25/20 for the exam, diagnosis, procedures, and orders are all accurate and complete.  The entirety of the information documented in the History of Present Illness, Review of Systems and Physical Exam were personally obtained by me. Portions of this information were initially documented by [facility] and reviewed by me for thoroughness and accuracy.     Maryella Shivers  [facility] [phone] (phone) [phone] (fax)  [facility]

## 2020-04-26 LAB — COMPREHENSIVE METABOLIC PANEL
ALT: 17 IU/L (ref 0–44)
AST: 16 IU/L (ref 0–40)
Albumin/Globulin Ratio: 1.5 (ref 1.2–2.2)
Albumin: 4.3 g/dL (ref 3.8–4.9)
Alkaline Phosphatase: 107 IU/L (ref 44–121)
BUN/Creatinine Ratio: 9 (ref 9–20)
BUN: 8 mg/dL (ref 6–24)
Bilirubin Total: <0.2 mg/dL (ref 0.0–1.2)
CO2: 21 mmol/L (ref 20–29)
Calcium: 9.2 mg/dL (ref 8.7–10.2)
Chloride: 104 mmol/L (ref 96–106)
Creatinine, Ser: 0.87 mg/dL (ref 0.76–1.27)
GFR calc Af Amer: 116 mL/min/{1.73_m2} (ref >59)
GFR calc non Af Amer: 100 mL/min/{1.73_m2} (ref >59)
Globulin, Total: 2.8 g/dL (ref 1.5–4.5)
Glucose: 83 mg/dL (ref 65–99)
Potassium: 4 mmol/L (ref 3.5–5.2)
Sodium: 142 mmol/L (ref 134–144)
Total Protein: 7.1 g/dL (ref 6.0–8.5)

## 2020-04-26 LAB — CBC WITH DIFFERENTIAL/PLATELET
Basophils Absolute: 0.1 10*3/uL (ref 0.0–0.2)
Basos: 1 %
EOS (ABSOLUTE): 0.5 10*3/uL — ABNORMAL HIGH (ref 0.0–0.4)
Eos: 4 %
Hematocrit: 42 % (ref 37.5–51.0)
Hemoglobin: 13.7 g/dL (ref 13.0–17.7)
Immature Grans (Abs): 0 10*3/uL (ref 0.0–0.1)
Immature Granulocytes: 0 %
Lymphocytes Absolute: 1.4 10*3/uL (ref 0.7–3.1)
Lymphs: 13 %
MCH: 28.1 pg (ref 26.6–33.0)
MCHC: 32.6 g/dL (ref 31.5–35.7)
MCV: 86 fL (ref 79–97)
Monocytes Absolute: 0.9 10*3/uL (ref 0.1–0.9)
Monocytes: 8 %
Neutrophils Absolute: 8.5 10*3/uL — ABNORMAL HIGH (ref 1.4–7.0)
Neutrophils: 74 %
Platelets: 361 10*3/uL (ref 150–450)
RBC: 4.87 x10E6/uL (ref 4.14–5.80)
RDW: 12.8 % (ref 11.6–15.4)
WBC: 11.4 10*3/uL — ABNORMAL HIGH (ref 3.4–10.8)

## 2020-04-26 LAB — LIPID PANEL
Chol/HDL Ratio: 3.6 ratio (ref 0.0–5.0)
Cholesterol, Total: 198 mg/dL (ref 100–199)
HDL: 55 mg/dL (ref >39)
LDL Chol Calc (NIH): 136 mg/dL — ABNORMAL HIGH (ref 0–99)
Triglycerides: 36 mg/dL (ref 0–149)
VLDL Cholesterol Cal: 7 mg/dL (ref 5–40)

## 2020-04-26 LAB — TSH: TSH: 1.42 u[IU]/mL (ref 0.450–4.500)

## 2020-04-26 LAB — HEPATITIS C ANTIBODY: Hep C Virus Ab: <0.1 s/co ratio (ref 0.0–0.9)

## 2020-04-26 LAB — PSA: Prostate Specific Ag, Serum: 0.5 ng/mL (ref 0.0–4.0)

## 2020-05-14 ENCOUNTER — Other Ambulatory Visit: Payer: Self-pay | Admitting: Physician Assistant

## 2020-05-14 DIAGNOSIS — I10 Essential (primary) hypertension: Secondary | ICD-10-CM

## 2020-05-14 MED FILL — HYDROCHLOROTHIAZIDE 12.5 MG: 12.5 | 90 days supply | Qty: 90 | Fill #0

## 2020-05-16 ENCOUNTER — Other Ambulatory Visit: Payer: Self-pay | Admitting: Physician Assistant

## 2020-05-16 DIAGNOSIS — I10 Essential (primary) hypertension: Secondary | ICD-10-CM

## 2020-05-16 MED FILL — LISINOPRIL 20 MG TABS: 20 | 30 days supply | Qty: 60 | Fill #0

## 2020-05-22 MED FILL — TOPIRAMATE 100 MG TABLET: 100 | 90 days supply | Qty: 180 | Fill #0

## 2020-05-22 MED FILL — PHENYTOIN SOD EXT 200 MG CA: 200 | 90 days supply | Qty: 180 | Fill #0

## 2020-06-12 MED FILL — LISINOPRIL 20 MG TABS: 20 | 30 days supply | Qty: 60 | Fill #1

## 2020-07-10 MED FILL — LISINOPRIL 20 MG TABS: 20 | 30 days supply | Qty: 60 | Fill #2

## 2020-08-06 MED FILL — HYDROCHLOROTHIAZIDE 12.5 MG: 12.5 | 90 days supply | Qty: 90 | Fill #1

## 2020-09-09 MED FILL — LISINOPRIL 20 MG TABS: 20 | 30 days supply | Qty: 60 | Fill #4

## 2020-10-09 MED FILL — LISINOPRIL 20 MG TABS: 20 | 30 days supply | Qty: 60 | Fill #5

## 2020-10-11 ENCOUNTER — Other Ambulatory Visit (HOSPITAL_BASED_OUTPATIENT_CLINIC_OR_DEPARTMENT_OTHER): Payer: Self-pay

## 2020-10-24 ENCOUNTER — Ambulatory Visit: Payer: Self-pay | Admitting: Physician Assistant

## 2020-11-01 ENCOUNTER — Other Ambulatory Visit (HOSPITAL_COMMUNITY): Payer: Self-pay

## 2020-11-01 ENCOUNTER — Other Ambulatory Visit: Payer: Self-pay

## 2020-11-01 ENCOUNTER — Ambulatory Visit (INDEPENDENT_AMBULATORY_CARE_PROVIDER_SITE_OTHER): Payer: No Typology Code available for payment source | Admitting: Medical

## 2020-11-01 ENCOUNTER — Encounter: Payer: Self-pay | Admitting: Medical

## 2020-11-01 VITALS — BP 122/80 | HR 89 | Ht 72.0 in | Wt 274.8 lb

## 2020-11-01 DIAGNOSIS — M25561 Pain in right knee: Secondary | ICD-10-CM | POA: Diagnosis not present

## 2020-11-01 DIAGNOSIS — G40909 Epilepsy, unspecified, not intractable, without status epilepticus: Secondary | ICD-10-CM | POA: Diagnosis not present

## 2020-11-01 DIAGNOSIS — I10 Essential (primary) hypertension: Secondary | ICD-10-CM | POA: Insufficient documentation

## 2020-11-01 DIAGNOSIS — E782 Mixed hyperlipidemia: Secondary | ICD-10-CM | POA: Diagnosis not present

## 2020-11-01 DIAGNOSIS — H029 Unspecified disorder of eyelid: Secondary | ICD-10-CM

## 2020-11-01 MED ORDER — LISINOPRIL 20 MG PO TABS
ORAL_TABLET | Freq: Two times a day (BID) | ORAL | 0 refills | Status: DC
Start: 2020-11-01 — End: 2021-02-10
  Filled 2020-11-01: qty 180, 90d supply, fill #0

## 2020-11-01 MED ORDER — HYDROCHLOROTHIAZIDE 12.5 MG PO CAPS
ORAL_CAPSULE | Freq: Every day | ORAL | 0 refills | Status: DC
  Filled 2020-11-01: qty 90, 90d supply, fill #0

## 2020-11-01 NOTE — Patient Instructions (Signed)
Right knee pain  arthritis vs inflammation  Recommendations  Use ice over the next 3-5 days, 20 minutes on , 20 minutes off  Elevated leg on pillow when doing ice therapy  Consider knee sleeve for the next 1-2 weeks  Relative rest, avoid excessive bending or excessive yard work short term  Scientist, research (medical) Aleve over the counter ,1 tablet daily or twice daily for the next week  If inflammation, this should resolve within a week or so  If arthritis, this can flare from time to time ongoing    Please go to [facility] for your right knee  xray.   Their hours are 8am - 4:30 pm Monday - Friday.  Take your insurance card with you.  [facility] [phone]  [address], [address] [place], [place] [address]  [address] [place], [place] [address]

## 2020-11-01 NOTE — Progress Notes (Signed)
Subjective: Chief Complaint  Patient presents with  . New Patient (Initial Visit)    Pt has burning in right knee    Medical team: Dr. Cristopher Peru at [facility] in [place]  Here as a new patient to establish care.  Was seeing [facility] prior.  Prior PCP retired, and the newer provider left as well.   He notes some burning in right knee.  Been having 3 weeks of pain.   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  Thinks it could be arthritis. May have had some swelling subtle, but been using ice.  Using no medication.  No fall, no injury, no trauma.   No hip or ankle pain.  Walks about 15,000 steps daily at work.   HTN - compliant with medicaiton  Seizures - compliant with medicaiton, sees neurology 1 x per year, very controlled.  Past Medical History:  Diagnosis Date  . Hyperlipidemia   . Hypertension   . Seizures (HCC)    ROS as in subjective   Objective: BP 122/80   Pulse 89   Ht 6' (1.829 m)   Wt 274 lb 12.8 oz (124.6 kg)   SpO2 96%   BMI 37.27 kg/m   Gen: wd, wn, nad, African American Right knee with mild MCL and medial joint line tendnerss, otherwise nontender, no swelling, no deformity, normal ROM.  Rest of leg unremarkable Lungs clear Heart rrr, normal s1, s2, no murmurs Ext: no edema Legs neurovascularly intact Bullous appearing round raised 1.3 cm diameter growth at corner of right eyelid lower, similar more spongy pink appearing round 1.4cm diameter raised growth of right lateral eyelid Eyes: PERRLA, EOMi    Assessment: Encounter Diagnoses  Name Primary?  . Acute pain of right knee Yes  . Seizure disorder (HCC)   . Primary hypertension   . Mixed hyperlipidemia   . Essential hypertension   . Eyelid lesion      Plan: Right knee pain   Patient Instructions  Right knee pain  arthritis vs inflammation  Recommendations  Use ice over the next 3-5 days, 20 minutes on , 20 minutes off  Elevated leg on  pillow when doing ice therapy  Consider knee sleeve for the next 1-2 weeks  Relative rest, avoid excessive bending or excessive yard work short term  Scientist, research (medical) Aleve over the counter ,1 tablet daily or twice daily for the next week  If inflammation, this should resolve within a week or so  If arthritis, this can flare from time to time ongoing    Please go to [facility] for your right knee  xray.   Their hours are 8am - 4:30 pm Monday - Friday.  Take your insurance card with you.  [facility] [phone]  [address], [address] [place], [place] [address]  [address] [place], [place] [address]  HTN - controlled on current medication  Seizure disorder - managed by neurology   Masyn was seen today for new patient (initial visit).  Diagnoses and all orders for this visit:  Acute pain of right knee -     DG Knee Complete 4 Views Right; Future  Seizure disorder (HCC)  Primary hypertension  Mixed hyperlipidemia  Essential hypertension -     lisinopril (ZESTRIL) 20 MG tablet; TAKE 1 TABLET BY MOUTH TWO TIMES DAILY -     hydrochlorothiazide (MICROZIDE) 12.5 MG capsule; TAKE 1 CAPSULE BY MOUTH ONCE A DAY  Eyelid lesion -  Ambulatory referral to Ophthalmology   f/u pending xray

## 2020-11-01 NOTE — Progress Notes (Signed)
Done

## 2020-11-02 ENCOUNTER — Other Ambulatory Visit (HOSPITAL_COMMUNITY): Payer: Self-pay

## 2020-11-07 ENCOUNTER — Ambulatory Visit
Admission: RE | Admit: 2020-11-07 | Discharge: 2020-11-07 | Disposition: A | Discharge status: Home / Self Care | Payer: No Typology Code available for payment source | Source: Ambulatory Visit | Attending: Medical | Admitting: Medical

## 2020-11-07 DIAGNOSIS — M25561 Pain in right knee: Secondary | ICD-10-CM

## 2020-11-14 ENCOUNTER — Other Ambulatory Visit (HOSPITAL_COMMUNITY): Payer: Self-pay

## 2020-11-14 MED FILL — Phenytoin Sodium Extended Cap 200 MG: ORAL | 90 days supply | Qty: 180 | Fill #0 | Status: AC

## 2020-11-14 MED FILL — Topiramate Tab 100 MG: ORAL | 90 days supply | Qty: 180 | Fill #0 | Status: AC

## 2020-11-15 ENCOUNTER — Other Ambulatory Visit (HOSPITAL_COMMUNITY): Payer: Self-pay

## 2020-11-16 ENCOUNTER — Other Ambulatory Visit (HOSPITAL_COMMUNITY): Payer: Self-pay

## 2020-12-20 ENCOUNTER — Other Ambulatory Visit (HOSPITAL_COMMUNITY): Payer: Self-pay

## 2020-12-20 ENCOUNTER — Telehealth: Payer: No Typology Code available for payment source | Admitting: Physician Assistant

## 2020-12-20 ENCOUNTER — Encounter: Payer: Self-pay | Admitting: Physician Assistant

## 2020-12-20 DIAGNOSIS — B9689 Other specified bacterial agents as the cause of diseases classified elsewhere: Secondary | ICD-10-CM

## 2020-12-20 DIAGNOSIS — J208 Acute bronchitis due to other specified organisms: Secondary | ICD-10-CM

## 2020-12-20 MED ORDER — AZITHROMYCIN 250 MG PO TABS
ORAL_TABLET | ORAL | 0 refills | Status: AC
  Filled 2020-12-20: qty 6, 5d supply, fill #0

## 2020-12-20 MED ORDER — BENZONATATE 100 MG PO CAPS
100.0000 mg | ORAL_CAPSULE | Freq: Three times a day (TID) | ORAL | 0 refills | Status: DC | PRN
  Filled 2020-12-20: qty 30, 10d supply, fill #0

## 2020-12-20 NOTE — Patient Instructions (Signed)
Instructions sent to patients MyChart.

## 2020-12-20 NOTE — Progress Notes (Signed)
Cristian Ortiz, Cristian Ortiz are scheduled for a virtual visit with your provider today.    Just as we do with appointments in the office, we must obtain your consent to participate.  Your consent will be active for this visit and any virtual visit you may have with one of our providers in the next 365 days.    If you have a MyChart account, I can also send a copy of this consent to you electronically.  All virtual visits are billed to your insurance company just like a traditional visit in the office.  As this is a virtual visit, video technology does not allow for your provider to perform a traditional examination.  This may limit your provider's ability to fully assess your condition.  If your provider identifies any concerns that need to be evaluated in person or the need to arrange testing such as labs, EKG, etc, we will make arrangements to do so.    Although advances in technology are sophisticated, we cannot ensure that it will always work on either your end or our end.  If the connection with a video visit is poor, we may have to switch to a telephone visit.  With either a video or telephone visit, we are not always able to ensure that we have a secure connection.   I need to obtain your verbal consent now.   Are you willing to proceed with your visit today?   Cristian Ortiz. has provided verbal consent on 12/20/2020 for a virtual visit (video or telephone).   Piedad Climes, PA-C 12/20/2020  10:47 AM  Virtual Visit via Video   I connected with patient on 12/20/20 at 10:45 AM EDT by a video enabled telemedicine application and verified that I am speaking with the correct person using two identifiers.  Location patient: Home Location provider: [facility] Persons participating in the virtual visit: Patient, Provider  I discussed the limitations of evaluation and management by telemedicine and the availability of in person appointments. The patient expressed understanding and  agreed to proceed.  Subjective:   HPI:  Patient presents via [facility] today complaining of worsening chest congestion and cough.  Patient endorses last Tuesday he was working outside.  The next day he started developing chest congestion with a dry cough.  Since then cough has become productive of thick yellow sputum.  Denies fever or body aches.  Had some chills initially that have resolved.  Has noted some very mild postnasal drip.  Denies sinus pain, ear pain or tooth pain.  Denies any known sick contact.  States he has been COVID tested and this is negative.  States he typically gets a bronchitis yearly around this time.  ROS:   See pertinent positives and negatives per HPI.  Patient Active Problem List   Diagnosis Date Noted  . Acute pain of right knee 11/01/2020  . Essential hypertension 11/01/2020  . Eyelid lesion 11/01/2020  . Abnormal pigmentation 11/01/2019  . Gynecomastia 11/01/2019  . Hypertension 11/01/2019  . Obesity 11/01/2019  . Reactive airway disease 11/01/2019  . Nocturnal epilepsy (HCC) 04/14/2014  . OSA (obstructive sleep apnea) 04/14/2014  . Hyperlipidemia 01/31/2008  . Fam hx-ischem heart disease 05/19/2006  . Allergic rhinitis 07/11/2005  . Seizure disorder (HCC) 07/11/2005  . Benign essential HTN 07/11/2005    Social History   Tobacco Use  . Smoking status: Never Smoker  . Smokeless tobacco: Never Used  Substance Use Topics  . Alcohol use: No  Current Outpatient Medications:  .  folic acid (FOLVITE) 1 MG tablet, Take by mouth., Disp: , Rfl:  .  hydrochlorothiazide (MICROZIDE) 12.5 MG capsule, TAKE 1 CAPSULE BY MOUTH ONCE A DAY, Disp: 90 capsule, Rfl: 0 .  lisinopril (ZESTRIL) 20 MG tablet, TAKE 1 TABLET BY MOUTH TWO TIMES DAILY, Disp: 180 tablet, Rfl: 0 .  LORazepam (ATIVAN) 2 MG tablet, Take 2 mg by mouth daily as needed for anxiety or seizure., Disp: , Rfl:  .  phenytoin (DILANTIN) 200 MG ER capsule, TAKE 1 CAPSULE (200 MG TOTAL) BY MOUTH 2  (TWO) TIMES DAILY, Disp: 180 capsule, Rfl: 3 .  topiramate (TOPAMAX) 100 MG tablet, TAKE 1 TABLET BY MOUTH TWICE DAILY, Disp: 180 tablet, Rfl: 3  No Known Allergies  Objective:   There were no vitals taken for this visit.  Patient is well-developed, well-nourished in no acute distress.  Resting comfortably  at home.  Head is normocephalic, atraumatic.  No labored breathing.  Speech is clear and coherent with logical content.  Patient is alert and oriented at baseline.   Assessment and Plan:   1. Acute bacterial bronchitis Negative COVID test.  Rx azithromycin.  Increase fluids.  Rest.  Saline nasal spray.  Probiotic.  Mucinex as directed.  Humidifier in bedroom.  Tessalon per orders.  Call or return to clinic if symptoms are not improving.  - azithromycin (ZITHROMAX) 250 MG tablet; Take 2 tablets on day 1, then 1 tablet daily on days 2 through 5  Dispense: 6 tablet; Refill: 0 - benzonatate (TESSALON) 100 MG capsule; Take 1 capsule (100 mg total) by mouth 3 (three) times daily as needed for cough.  Dispense: 30 capsule; Refill: 0 .   Piedad Climes, [place] 12/20/2020

## 2021-02-10 ENCOUNTER — Other Ambulatory Visit: Payer: Self-pay | Admitting: Medical

## 2021-02-10 DIAGNOSIS — I10 Essential (primary) hypertension: Secondary | ICD-10-CM

## 2021-02-10 MED FILL — Phenytoin Sodium Extended Cap 200 MG: ORAL | 90 days supply | Qty: 180 | Fill #1 | Status: CN

## 2021-02-10 MED FILL — Topiramate Tab 100 MG: ORAL | 90 days supply | Qty: 180 | Fill #1 | Status: AC

## 2021-02-11 ENCOUNTER — Other Ambulatory Visit: Payer: Self-pay

## 2021-02-11 ENCOUNTER — Other Ambulatory Visit (HOSPITAL_COMMUNITY): Payer: Self-pay

## 2021-02-11 MED ORDER — HYDROCHLOROTHIAZIDE 12.5 MG PO CAPS
ORAL_CAPSULE | Freq: Every day | ORAL | 0 refills | Status: DC
  Filled 2021-02-11: qty 90, 90d supply, fill #0

## 2021-02-11 MED ORDER — LISINOPRIL 20 MG PO TABS
ORAL_TABLET | Freq: Two times a day (BID) | ORAL | 0 refills | Status: DC
  Filled 2021-02-11: qty 180, 90d supply, fill #0

## 2021-02-11 MED FILL — Phenytoin Sodium Extended Cap 200 MG: ORAL | 90 days supply | Qty: 180 | Fill #1 | Status: AC

## 2021-02-12 ENCOUNTER — Other Ambulatory Visit (HOSPITAL_COMMUNITY): Payer: Self-pay

## 2021-03-01 ENCOUNTER — Other Ambulatory Visit (HOSPITAL_COMMUNITY): Payer: Self-pay

## 2021-03-01 MED ORDER — TOPIRAMATE 100 MG PO TABS
100.0000 mg | ORAL_TABLET | Freq: Two times a day (BID) | ORAL | 3 refills | Status: DC
  Filled 2021-03-01: qty 180, 90d supply, fill #0

## 2021-03-01 MED ORDER — PHENYTOIN SODIUM EXTENDED 200 MG PO CAPS
ORAL_CAPSULE | ORAL | 3 refills | Status: DC
  Filled 2021-03-01: qty 180, 90d supply, fill #0

## 2021-03-04 ENCOUNTER — Encounter: Payer: Self-pay | Admitting: Neurology

## 2021-04-30 ENCOUNTER — Encounter: Payer: Self-pay | Admitting: Internal Medicine

## 2021-05-09 ENCOUNTER — Encounter: Payer: Self-pay | Admitting: Neurology

## 2021-05-09 ENCOUNTER — Other Ambulatory Visit (HOSPITAL_COMMUNITY): Payer: Self-pay

## 2021-05-09 ENCOUNTER — Ambulatory Visit (INDEPENDENT_AMBULATORY_CARE_PROVIDER_SITE_OTHER): Payer: No Typology Code available for payment source | Admitting: Neurology

## 2021-05-09 ENCOUNTER — Other Ambulatory Visit: Payer: Self-pay

## 2021-05-09 ENCOUNTER — Other Ambulatory Visit: Payer: Self-pay | Admitting: Medical

## 2021-05-09 VITALS — BP 117/75 | HR 85 | Ht 76.0 in | Wt 275.6 lb

## 2021-05-09 DIAGNOSIS — G40909 Epilepsy, unspecified, not intractable, without status epilepticus: Secondary | ICD-10-CM | POA: Diagnosis not present

## 2021-05-09 DIAGNOSIS — Z79899 Other long term (current) drug therapy: Secondary | ICD-10-CM | POA: Diagnosis not present

## 2021-05-09 DIAGNOSIS — I10 Essential (primary) hypertension: Secondary | ICD-10-CM

## 2021-05-09 MED ORDER — PHENYTOIN SODIUM EXTENDED 200 MG PO CAPS
ORAL_CAPSULE | ORAL | 3 refills | Status: DC
  Filled 2021-05-09: qty 180, 90d supply, fill #0
  Filled 2021-08-07: qty 180, 90d supply, fill #1

## 2021-05-09 MED ORDER — TOPIRAMATE 100 MG PO TABS
100.0000 mg | ORAL_TABLET | Freq: Two times a day (BID) | ORAL | 3 refills | Status: DC
  Filled 2021-05-09: qty 180, 90d supply, fill #0
  Filled 2021-08-07: qty 180, 90d supply, fill #1
  Filled 2021-11-09: qty 180, 90d supply, fill #2
  Filled 2022-02-03: qty 180, 90d supply, fill #3

## 2021-05-09 NOTE — Patient Instructions (Signed)
Good to meet you!  Refills for Topiramate 100mg  twice a day and Phenytoin 200mg  twice a day have been sent.  2. Schedule bone density scan  3. Follow-up in 1 year, call for any changes   Seizure Precautions: 1. If medication has been prescribed for you to prevent seizures, take it exactly as directed.  Do not stop taking the medicine without talking to your doctor first, even if you have not had a seizure in a long time.   2. Avoid activities in which a seizure would cause danger to yourself or to others.  Don't operate dangerous machinery, swim alone, or climb in high or dangerous places, such as on ladders, roofs, or girders.  Do not drive unless your doctor says you may.  3. If you have any warning that you may have a seizure, lay down in a safe place where you can't hurt yourself.    4.  No driving for 6 months from last seizure, as per [facility].   Please refer to the following link on the Epilepsy Foundation of America's website for more information: http://www.epilepsyfoundation.org/answerplace/Social/driving/drivingu.cfm   5.  Maintain good sleep hygiene.   6.  Contact your doctor if you have any problems that may be related to the medicine you are taking.  7.  Call 911 and bring the patient back to the ED if:        A.  The seizure lasts longer than 5 minutes.       B.  The patient doesn't awaken shortly after the seizure  C.  The patient has new problems such as difficulty seeing, speaking or moving  D.  The patient was injured during the seizure  E.  The patient has a temperature over 102 F (39C)  F.  The patient vomited and now is having trouble breathing

## 2021-05-09 NOTE — Progress Notes (Signed)
NEUROLOGY CONSULTATION NOTE  Cristian Ortiz. MRN: [id number] DOB: 10-13-1965  Referring provider: Dr. Cristopher Peru Primary care provider: Crosby Oyster, PA-C  Reason for consult:  establish care for seizures  Dear Dr Sherryll Burger:  Thank you for your kind referral of Cristian Ortiz. for consultation of the above symptoms. Although his history is well known to you, please allow me to reiterate it for the purpose of our medical record. He is alone in the office today. Records and images were personally reviewed where available.   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 Records from his neurologist Dr. Sherryll Burger were reviewed. He was last seen by Dr. Sherryll Burger in 02/2021. Per notes, first seizure occurred in 2000. He was started on Dilantin and went seizure-free for a year until he had a seizure and dislocated his right shoulder. There is note of a history of status epilepticus in 2004. He reports his seizures are exclusively nocturnal. Notes indicate that his wife has reported he would be staring, unresponsive with lip smacking/chewing movements, trying to get up. He would be confused for 1-2 hours after, agitated. He has had bowel incontinence and tongue bite with the seizures, no focal weakness. There is a note of generalized convulsions as well. They usually occur 30-60 minutes after sleep. Per notes, MRI and EEG in 2004 were done, results unavailable for review. His last seizure was in 09/2017 in the setting of missed medication. He has been on Topiramate 100mg  BID and Phenytoin 200mg  BID for many years without side effects. His wife has Ativan by her bedside to administer, but has not needed it as he has been doing well. He denies any olfactory/gustatory hallucinations, deja vu, rising epigastric sensation, focal numbness/tingling/weakness,  myoclonic jerks. He denies any headaches, dizziness, vision changes, neck/back pain, bowel/bladder dysfunction.He has recently switched his work schedule and this is helping with his sleep, mood, and memory. He is compliant with medications and has a pillbox. No falls.   Epilepsy Risk Factors:  He had a normal birth and early development.  There is no history of febrile convulsions, CNS infections such as meningitis/encephalitis, significant traumatic brain injury, neurosurgical procedures, or family history of seizures.  Laboratory Data:  02/2021:  Dilantin level 8 Vitamin D normal 37.7  Diagnostic Data: No prior EEG or MRI available for review   PAST MEDICAL HISTORY: Past Medical History:  Diagnosis Date   Hyperlipidemia    Hypertension    Seizures (HCC)     PAST SURGICAL HISTORY: Past Surgical History:  Procedure Laterality Date   KNEE SURGERY      MEDICATIONS: Current Outpatient Medications on File Prior to Visit  Medication Sig Dispense Refill   benzonatate (TESSALON) 100 MG capsule Take 1 capsule (100 mg total) by mouth 3 (three) times daily as needed for cough. 30 capsule 0   folic acid (FOLVITE) 1 MG tablet Take by mouth.     hydrochlorothiazide (MICROZIDE) 12.5 MG capsule TAKE 1 CAPSULE BY MOUTH ONCE A DAY 90 capsule 0   lisinopril (ZESTRIL) 20 MG tablet TAKE 1 TABLET BY MOUTH TWO TIMES DAILY 180 tablet 0   LORazepam (ATIVAN) 2 MG tablet Take 2 mg by mouth daily as needed for anxiety or seizure.     phenytoin (DILANTIN) 200 MG ER capsule Take 1 capsule (200 mg total) by mouth 2 (two) times daily 180 capsule 3  topiramate (TOPAMAX) 100 MG tablet Take 1 tablet (100 mg total) by mouth 2 (two) times daily 180 tablet 3   No current facility-administered medications on file prior to visit.    ALLERGIES: No Known Allergies  FAMILY HISTORY: Family History  Problem Relation Age of Onset   Hypertension Mother    Diabetes Father    Cataracts Father    Hypertension  Sister    Hypertension Brother    Hypertension Brother    Congestive Heart Failure Brother     SOCIAL HISTORY: Social History   Socioeconomic History   Marital status: Married    Spouse name: Not on file   Number of children: Not on file   Years of education: Not on file   Highest education level: Not on file  Occupational History   Not on file  Tobacco Use   Smoking status: Never   Smokeless tobacco: Never  Vaping Use   Vaping Use: Never used  Substance and Sexual Activity   Alcohol use: No   Drug use: No   Sexual activity: Yes    Partners: Female  Other Topics Concern   Not on file  Social History Narrative   Right handed    Works for [employer] hosp   Social Determinants of Health   Financial Resource Strain: Not on file  Food Insecurity: Not on file  Transportation Needs: Not on file  Physical Activity: Not on file  Stress: Not on file  Social Connections: Not on file  Intimate Partner Violence: Not on file     PHYSICAL EXAM: Vitals:   05/09/21 1013  BP: 117/75  Pulse: 85  SpO2: 99%   General: No acute distress Head:  Normocephalic/atraumatic. He has skin growths on the lateral side of both eyes that do not affect vision Skin/Extremities: No rash, no edema Neurological Exam: Mental status: alert and oriented to person, place, and time, no dysarthria or aphasia, Fund of knowledge is appropriate.  Recent and remote memory are intact, 3/3 delayed recall.  Attention and concentration are normal, 5/5 WORLD backwards.  Cranial nerves: CN I: not tested CN II: pupils equal, round and reactive to light, visual fields intact CN III, IV, VI:  full range of motion, no nystagmus, no ptosis CN V: facial sensation intact CN VII: upper and lower face symmetric CN VIII: hearing intact to conversation Bulk & Tone: normal, no fasciculations. Motor: 5/5 throughout with no pronator drift. Sensation: intact to light touch, cold, pin, vibration and joint position sense.  No  extinction to double simultaneous stimulation.  Romberg test negative Deep Tendon Reflexes: +1 throughout Cerebellar: no incoordination on finger to nose testing Gait: narrow-based and steady, able to tandem walk adequately. 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 He has been seizure-free since 09/2017 (due to missed medication) on Topiramate 100mg  BID and Phenytoin 200mg  BID, refills sent. He has prn Ativan for rescue. We discussed potential effects of long-term Phenytoin use on bone health, recent vitamin D level normal, recommend bone density scan every 3 years. We discussed avoidance of seizure triggers, including missing medications, sleep deprivations, and alcohol. He is aware of [place] driving laws to stop driving after a seizure, until 6 months seizure-free. Follow-up in 1 year, call for any changes.   Thank you for allowing me to participate in the care of this patient. Please do not hesitate to  call for any questions or concerns.   Patrcia Dolly, M.D.  CC: Crosby Oyster, PA-C, Dr. Sherryll Burger

## 2021-05-10 ENCOUNTER — Other Ambulatory Visit: Payer: Self-pay | Admitting: Medical

## 2021-05-10 ENCOUNTER — Other Ambulatory Visit (HOSPITAL_COMMUNITY): Payer: Self-pay

## 2021-05-10 DIAGNOSIS — I10 Essential (primary) hypertension: Secondary | ICD-10-CM

## 2021-05-10 MED ORDER — LISINOPRIL 20 MG PO TABS
ORAL_TABLET | Freq: Two times a day (BID) | ORAL | 0 refills | Status: DC
  Filled 2021-05-10: qty 180, 90d supply, fill #0

## 2021-05-10 MED ORDER — HYDROCHLOROTHIAZIDE 12.5 MG PO CAPS
ORAL_CAPSULE | Freq: Every day | ORAL | 0 refills | Status: DC
  Filled 2021-05-10: qty 30, 30d supply, fill #0

## 2021-05-10 NOTE — Telephone Encounter (Signed)
Left message for pt to call back to schedule an appointment as he is overdue

## 2021-05-21 ENCOUNTER — Other Ambulatory Visit: Payer: Self-pay

## 2021-05-21 ENCOUNTER — Ambulatory Visit (INDEPENDENT_AMBULATORY_CARE_PROVIDER_SITE_OTHER): Payer: No Typology Code available for payment source | Admitting: Medical

## 2021-05-21 ENCOUNTER — Ambulatory Visit: Payer: No Typology Code available for payment source | Admitting: Neurology

## 2021-05-21 VITALS — BP 120/80 | HR 74 | Wt 272.2 lb

## 2021-05-21 DIAGNOSIS — R9431 Abnormal electrocardiogram [ECG] [EKG]: Secondary | ICD-10-CM | POA: Insufficient documentation

## 2021-05-21 DIAGNOSIS — E782 Mixed hyperlipidemia: Secondary | ICD-10-CM

## 2021-05-21 DIAGNOSIS — G40909 Epilepsy, unspecified, not intractable, without status epilepticus: Secondary | ICD-10-CM | POA: Diagnosis not present

## 2021-05-21 DIAGNOSIS — Z1211 Encounter for screening for malignant neoplasm of colon: Secondary | ICD-10-CM | POA: Insufficient documentation

## 2021-05-21 DIAGNOSIS — Z6833 Body mass index (BMI) 33.0-33.9, adult: Secondary | ICD-10-CM | POA: Insufficient documentation

## 2021-05-21 DIAGNOSIS — Z8249 Family history of ischemic heart disease and other diseases of the circulatory system: Secondary | ICD-10-CM | POA: Diagnosis not present

## 2021-05-21 DIAGNOSIS — G4733 Obstructive sleep apnea (adult) (pediatric): Secondary | ICD-10-CM | POA: Diagnosis not present

## 2021-05-21 DIAGNOSIS — I10 Essential (primary) hypertension: Secondary | ICD-10-CM | POA: Diagnosis not present

## 2021-05-21 DIAGNOSIS — Z125 Encounter for screening for malignant neoplasm of prostate: Secondary | ICD-10-CM | POA: Insufficient documentation

## 2021-05-21 NOTE — Progress Notes (Signed)
Subjective:  Cristian Ortiz. is a 55 y.o. male who presents for Chief Complaint  Patient presents with   med check    Med check, already had flu shot      Here for med check.  Medical team: Dr. Patrcia Dolly, neurology Looking for a dentist No current eye doctor Ryann Leavitt, Kermit Balo, PA-C established with me 10/2020  He establish care as a new patient in April 2022  His medical problems include seizures, hypertension, hyperlipidemia, sleep apnea  Hypertension-compliant with lisinopril 20 mg and hydrochlorothiazide 12.5 mg daily  Hyperlipidemia-not currently on medication for thi.  Never been on medication for this.  Limits fried food, bakes a lot of his foods.    Exercise - very active on the job, walks lots on the job, 15000-20000 steps daily at work  Seizure disorder-compliant with phenytoin and Topamax per neurology  Using an OTC brace for knee pain.  Did a cologard test 10/27/2019 negative.  Nonsmoker, no alcohol   No other aggravating or relieving factors.    No other c/o.  Past Medical History:  Diagnosis Date   Hyperlipidemia    Hypertension    Seizures (HCC)    Current Outpatient Medications on File Prior to Visit  Medication Sig Dispense Refill   folic acid (FOLVITE) 1 MG tablet Take by mouth.     hydrochlorothiazide (MICROZIDE) 12.5 MG capsule TAKE 1 CAPSULE BY MOUTH ONCE A DAY 30 capsule 0   lisinopril (ZESTRIL) 20 MG tablet TAKE 1 TABLET BY MOUTH TWO TIMES DAILY 180 tablet 0   LORazepam (ATIVAN) 2 MG tablet Take 2 mg by mouth daily as needed for anxiety or seizure.     phenytoin (DILANTIN) 200 MG ER capsule Take 1 capsule (200 mg total) by mouth 2 (two) times daily 180 capsule 3   topiramate (TOPAMAX) 100 MG tablet Take 1 tablet (100 mg total) by mouth 2 (two) times daily 180 tablet 3   No current facility-administered medications on file prior to visit.   Family History  Problem Relation Age of Onset   Hypertension Mother    Diabetes Father     Cataracts Father    Hypertension Sister    Hypertension Brother    Hypertension Brother    Congestive Heart Failure Brother      The following portions of the patient's history were reviewed and updated as appropriate: allergies, current medications, past family history, past medical history, past social history, past surgical history and problem list.  ROS Otherwise as in subjective above   Objective: BP 120/80   Pulse 74   Wt 272 lb 3.2 oz (123.5 kg)   BMI 33.13 kg/m   General appearance: alert, no distress, well developed, well nourished Neck: supple, no lymphadenopathy, no thyromegaly, no masses, no bruits Heart: RRR, normal S1, S2, no murmurs Lungs: CTA bilaterally, no wheezes, rhonchi, or rales Pulses: 2+ radial pulses, 2+ pedal pulses, normal cap refill Ext: no edema   EKG indication screening for heart disease, rate 64 bpm, PR 156 ms, QRS 70 ms, QTC 458 ms, axis 32 degrees, normal sinus rhythm, nonspecific T wave abnormality V3 and 3, prolonged QT   Assessment: Encounter Diagnoses  Name Primary?   Seizure disorder (HCC) Yes   OSA (obstructive sleep apnea)    Primary hypertension    Mixed hyperlipidemia    Fam hx-ischem heart disease    BMI 33.0-33.9,adult    Screen for colon cancer    Screening for prostate cancer    Abnormal  EKG      Plan: Hypertension-at goal, continue current medications  Hyperlipidemia-October 2021 labs showed LDL 136 otherwise lipid panel was normal  Seizure disorder-managed by neurology, compliant with medications  OSA- not currently on CPAP.  Uses sleep member bed for head of bed elevation.  Avoids sleeping on back.    BMI 33-discussed need to lose weight through healthy diet and exercise  I reviewed labs in the chart record from March 01, 2021, [facility] with full comprehensive metabolic panel that was normal, CBC showed slightly elevated neutrophils of 76% and hemoglobin slightly low at 13.9 otherwise CBC  normal, B12 and folate normal  Screen for colon cancer - he notes cologard negative 10/2019  Abnormal EKG-pending labs will plan to refer to cardiology  Haitham was seen today for med check.  Diagnoses and all orders for this visit:  Seizure disorder (HCC)  OSA (obstructive sleep apnea)  Primary hypertension -     Lipid panel -     EKG 12-Lead  Mixed hyperlipidemia -     Lipid panel -     EKG 12-Lead  Fam hx-ischem heart disease -     EKG 12-Lead  BMI 33.0-33.9,adult  Screen for colon cancer  Screening for prostate cancer -     PSA  Abnormal EKG   Follow up: pending labs

## 2021-05-21 NOTE — Patient Instructions (Signed)
DENTIST RECOMMENDATION Dr. Yancey Flemings, dentist [address], [place], [place] [address] [phone] [email or web address]   Ophthalmologists [facility] [address] [place], [place], [place] [address] [phone]  [facility] [address] [place], [place] [address] [phone]  [facility] [address] [place], [place] [address] [phone]  [facility]  [address] [place], [place], [place] [address] [phone]  [facility] [address] [place], [place] [address] [phone]

## 2021-05-22 ENCOUNTER — Other Ambulatory Visit (HOSPITAL_COMMUNITY): Payer: Self-pay

## 2021-05-22 ENCOUNTER — Other Ambulatory Visit: Payer: Self-pay | Admitting: Medical

## 2021-05-22 DIAGNOSIS — R9431 Abnormal electrocardiogram [ECG] [EKG]: Secondary | ICD-10-CM

## 2021-05-22 DIAGNOSIS — I10 Essential (primary) hypertension: Secondary | ICD-10-CM

## 2021-05-22 LAB — LIPID PANEL
Chol/HDL Ratio: 3.7 ratio (ref 0.0–5.0)
Cholesterol, Total: 194 mg/dL (ref 100–199)
HDL: 52 mg/dL (ref >39)
LDL Chol Calc (NIH): 135 mg/dL — ABNORMAL HIGH (ref 0–99)
Triglycerides: 35 mg/dL (ref 0–149)
VLDL Cholesterol Cal: 7 mg/dL (ref 5–40)

## 2021-05-22 LAB — PSA: Prostate Specific Ag, Serum: 0.7 ng/mL (ref 0.0–4.0)

## 2021-05-22 MED ORDER — HYDROCHLOROTHIAZIDE 12.5 MG PO CAPS
ORAL_CAPSULE | Freq: Every day | ORAL | 1 refills | Status: DC
  Filled 2021-05-22: qty 90, 90d supply, fill #0
  Filled 2021-09-11: qty 90, 90d supply, fill #1

## 2021-05-22 MED ORDER — ROSUVASTATIN CALCIUM 10 MG PO TABS
10.0000 mg | ORAL_TABLET | Freq: Every day | ORAL | 3 refills | Status: DC
  Filled 2021-05-22: qty 90, 90d supply, fill #0
  Filled 2021-08-23: qty 90, 90d supply, fill #1

## 2021-05-30 ENCOUNTER — Other Ambulatory Visit (HOSPITAL_COMMUNITY): Payer: Self-pay

## 2021-05-31 ENCOUNTER — Encounter: Payer: Self-pay | Admitting: Internal Medicine

## 2021-05-31 ENCOUNTER — Ambulatory Visit (INDEPENDENT_AMBULATORY_CARE_PROVIDER_SITE_OTHER): Payer: No Typology Code available for payment source | Admitting: Internal Medicine

## 2021-05-31 VITALS — BP 126/75 | HR 84 | Ht 72.0 in | Wt 271.8 lb

## 2021-05-31 DIAGNOSIS — R9431 Abnormal electrocardiogram [ECG] [EKG]: Secondary | ICD-10-CM | POA: Diagnosis not present

## 2021-05-31 NOTE — Patient Instructions (Signed)
Medication Instructions:   Your physician recommends that you continue on your current medications as directed. Please refer to the Current Medication list given to you today.  *If you need a refill on your cardiac medications before your next appointment, please call your pharmacy*   Lab Work: NONE ORDERED  TODAY   If you have labs (blood work) drawn today and your tests are completely normal, you will receive your results only by: MyChart Message (if you have MyChart) OR A paper copy in the mail If you have any lab test that is abnormal or we need to change your treatment, we will call you to review the results.   Testing/Procedures: Your physician has requested that you have an echocardiogram. Echocardiography is a painless test that uses sound waves to create images of your heart. It provides your doctor with information about the size and shape of your heart and how well your heart's chambers and valves are working. This procedure takes approximately one hour. There are no restrictions for this procedure.    Follow-Up: At [facility], you and your health needs are our priority.  As part of our continuing mission to provide you with exceptional heart care, we have created designated Provider Care Teams.  These Care Teams include your primary Cardiologist (physician) and Advanced Practice Providers (APPs -  Physician Assistants and Nurse Practitioners) who all work together to provide you with the care you need, when you need it.  We recommend signing up for the patient portal called "MyChart".  Sign up information is provided on this After Visit Summary.  MyChart is used to connect with patients for Virtual Visits (Telemedicine).  Patients are able to view lab/test results, encounter notes, upcoming appointments, etc.  Non-urgent messages can be sent to your provider as well.   To learn more about what you can do with MyChart, go to [email or web address].    Your next appointment:   BASED UPON RESULTS     Other Instructions

## 2021-05-31 NOTE — Progress Notes (Signed)
Cardiology Office Note   Date:  05/31/2021   ID:  Kenyon Ana., DOB 06-01-1966, MRN [id number]  PCP:  Jac Canavan, PA-C  Cardiologist:   Dietrich Pates, MD   Patient referred for an abnormal EKG         History of Present Illness: Cristian Ortiz. is a 55 y.o. male with a history of hypertension, hyperlipidemia, nocturnal seizures.  Patient is on Dilantin.  The patient was seen by Crosby Oyster recently.  Had an EKG done.  This showed sinus rhythm.  Slight sagging of the ST segments inferiorly.  QT interval slightly prolonged.  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  He denies chest pain.  No shortness of breath.  No dizziness.  No palpitations.  Note Brother died of heart failure in his 75s.  Father died of pancreatic cancer but had a history of heart failure.  The patient was recently placed on a statin in clinic.  He has not gotten it yet.  Diet.  Patient usually skips breakfast.  Says he had a visit near ideal weight.  Tries to watch sugars.  Runs a [employer] as well.       Current Meds  Medication Sig   folic acid (FOLVITE) 1 MG tablet Take by mouth.   hydrochlorothiazide (MICROZIDE) 12.5 MG capsule TAKE 1 CAPSULE BY MOUTH ONCE A DAY   lisinopril (ZESTRIL) 20 MG tablet TAKE 1 TABLET BY MOUTH TWO TIMES DAILY   LORazepam (ATIVAN) 2 MG tablet Take 2 mg by mouth daily as needed for anxiety or seizure.   phenytoin (DILANTIN) 200 MG ER capsule Take 1 capsule (200 mg total) by mouth 2 (two) times daily   rosuvastatin (CRESTOR) 10 MG tablet Take 1 tablet (10 mg total) by mouth daily.   topiramate (TOPAMAX) 100 MG tablet Take 1 tablet (100 mg total) by mouth 2 (two) times daily     Allergies:   Patient has no known allergies.   Past Medical History:  Diagnosis Date   Hyperlipidemia    Hypertension    Seizures (HCC)     Past Surgical History:  Procedure Laterality Date   KNEE  SURGERY       Social History:  The patient  reports that he has never smoked. He has never used smokeless tobacco. He reports that he does not drink alcohol and does not use drugs.   Family History:  The patient's family history includes Cataracts in his father; Congestive Heart Failure in his brother; Diabetes in his father; Hypertension in his brother, brother, mother, and sister.    ROS:  Please see the history of present illness. All other systems are reviewed and  Negative to the above problem except as noted.    PHYSICAL EXAM: VS:  BP 126/75   Pulse 84   Ht 6' (1.829 m)   Wt 271 lb 12.8 oz (123.3 kg)   SpO2 97%   BMI 36.86 kg/m   GEN: Well nourished, well developed, in no acute distress  HEENT: normal  Neck: no JVD, carotid bruits Cardiac: RRR; no murmurs.  No lower extremity edema. Respiratory:  clear to auscultation bilaterally, GI: soft, nontender, nondistended, + BS  No hepatomegaly  MS: no deformity Moving all extremities   Skin: warm and dry, no rash Neuro:  Strength and sensation are intact Psych: euthymic mood, full affect   EKG:  EKG is not ordered  today.  See above from 11 1.   Lipid Panel    Component Value Date/Time   CHOL 194 05/21/2021 1123   TRIG 35 05/21/2021 1123   HDL 52 05/21/2021 1123   CHOLHDL 3.7 05/21/2021 1123   LDLCALC 135 (H) 05/21/2021 1123      Wt Readings from Last 3 Encounters:  05/31/21 271 lb 12.8 oz (123.3 kg)  05/21/21 272 lb 3.2 oz (123.5 kg)  05/09/21 275 lb 9.6 oz (125 kg)      ASSESSMENT AND PLAN:  1.  Abnormal EKG.  Subtle changes in the EKG have been noted in the past back in 2011 in the inferior leads.  QT slightly long, may reflect Dilantin.  Family history is concerning with the brother dying of heart failure at a young age.  Would set up for an echocardiogram.  To hypertension adequate control on current regimen.  Again goals 110s 120s low 130s.  3 dyslipidemia.  Recent LDL 135.  He has what looks like of  metabolic profile with his weight hypertension dyslipidemia.  Discussed diet with him I also talked about a calcium score CT it would define if he has coronary artery disease or not.  He said he had scans of his head at [facility] I do not see any of these to see if there is a signs of vascular abnormalities.  Again goal for LDL I would probably push more aggressively.  Follow-up will be as needed based on the echo results.   Current medicines are reviewed at length with the patient today.  The patient does not have concerns regarding medicines.  Signed, Dietrich Pates, MD  05/31/2021 9:08 AM    [facility] [address] [place], [place], [place]  [address] Phone: [phone]; Fax: [phone]

## 2021-06-03 ENCOUNTER — Other Ambulatory Visit (HOSPITAL_COMMUNITY): Payer: Self-pay

## 2021-06-25 ENCOUNTER — Ambulatory Visit (HOSPITAL_COMMUNITY): Payer: No Typology Code available for payment source | Attending: Cardiology

## 2021-06-25 ENCOUNTER — Other Ambulatory Visit: Payer: Self-pay

## 2021-06-25 DIAGNOSIS — R9431 Abnormal electrocardiogram [ECG] [EKG]: Secondary | ICD-10-CM | POA: Insufficient documentation

## 2021-06-25 LAB — ECHOCARDIOGRAM COMPLETE
Area-P 1/2: 3.53 cm2
S' Lateral: 3.2 cm

## 2021-07-17 ENCOUNTER — Other Ambulatory Visit (HOSPITAL_COMMUNITY): Payer: Self-pay

## 2021-07-17 ENCOUNTER — Other Ambulatory Visit: Payer: Self-pay

## 2021-07-17 ENCOUNTER — Encounter: Payer: Self-pay | Admitting: Medical

## 2021-07-17 ENCOUNTER — Ambulatory Visit (INDEPENDENT_AMBULATORY_CARE_PROVIDER_SITE_OTHER): Payer: No Typology Code available for payment source | Admitting: Medical

## 2021-07-17 VITALS — BP 120/74 | HR 74 | Temp 98.2°F | Ht 71.0 in | Wt 271.2 lb

## 2021-07-17 DIAGNOSIS — Z1211 Encounter for screening for malignant neoplasm of colon: Secondary | ICD-10-CM

## 2021-07-17 DIAGNOSIS — I10 Essential (primary) hypertension: Secondary | ICD-10-CM

## 2021-07-17 DIAGNOSIS — B356 Tinea cruris: Secondary | ICD-10-CM

## 2021-07-17 DIAGNOSIS — Z0000 Encounter for general adult medical examination without abnormal findings: Secondary | ICD-10-CM

## 2021-07-17 DIAGNOSIS — Z8669 Personal history of other diseases of the nervous system and sense organs: Secondary | ICD-10-CM

## 2021-07-17 DIAGNOSIS — G40909 Epilepsy, unspecified, not intractable, without status epilepticus: Secondary | ICD-10-CM

## 2021-07-17 DIAGNOSIS — E782 Mixed hyperlipidemia: Secondary | ICD-10-CM

## 2021-07-17 DIAGNOSIS — R21 Rash and other nonspecific skin eruption: Secondary | ICD-10-CM | POA: Insufficient documentation

## 2021-07-17 DIAGNOSIS — Z125 Encounter for screening for malignant neoplasm of prostate: Secondary | ICD-10-CM

## 2021-07-17 DIAGNOSIS — Z131 Encounter for screening for diabetes mellitus: Secondary | ICD-10-CM

## 2021-07-17 DIAGNOSIS — Z6837 Body mass index (BMI) 37.0-37.9, adult: Secondary | ICD-10-CM | POA: Insufficient documentation

## 2021-07-17 DIAGNOSIS — D649 Anemia, unspecified: Secondary | ICD-10-CM | POA: Insufficient documentation

## 2021-07-17 DIAGNOSIS — Z23 Encounter for immunization: Secondary | ICD-10-CM

## 2021-07-17 DIAGNOSIS — Z8249 Family history of ischemic heart disease and other diseases of the circulatory system: Secondary | ICD-10-CM

## 2021-07-17 DIAGNOSIS — Z7185 Encounter for immunization safety counseling: Secondary | ICD-10-CM | POA: Insufficient documentation

## 2021-07-17 MED ORDER — FLUCONAZOLE 100 MG PO TABS
100.0000 mg | ORAL_TABLET | Freq: Every day | ORAL | 0 refills | Status: DC
  Filled 2021-07-17: qty 12, 42d supply, fill #0

## 2021-07-17 MED ORDER — NYSTATIN 100000 UNIT/GM EX POWD
1.0000 "application " | Freq: Two times a day (BID) | CUTANEOUS | 0 refills | Status: DC
  Filled 2021-07-17: qty 60, 30d supply, fill #0

## 2021-07-17 MED ORDER — LISINOPRIL 20 MG PO TABS
ORAL_TABLET | Freq: Two times a day (BID) | ORAL | 3 refills | Status: DC
  Filled 2021-07-17: qty 180, 90d supply, fill #0
  Filled 2021-11-09: qty 180, 90d supply, fill #1
  Filled 2022-02-03: qty 180, 90d supply, fill #2
  Filled 2022-05-04: qty 180, 90d supply, fill #3

## 2021-07-17 NOTE — Progress Notes (Signed)
Subjective:   HPI  Cristian Ortiz. is a 55 y.o. male who presents for Chief Complaint  Patient presents with   Annual Exam    CPE no other issues right now     Patient Care Team: Tracey Stewart, Cleda Mccreedy as PCP - General (Family Medicine) Van Clines, MD as Consulting Physician (Neurology) Sees dentist Sees eye doctor Dr. Dietrich Pates, cardiology   Concerns: none  Reviewed their medical, surgical, family, social, medication, and allergy history and updated chart as appropriate.  Past Medical History:  Diagnosis Date   Hyperlipidemia    Hypertension    Seizures (HCC)     Past Surgical History:  Procedure Laterality Date   KNEE SURGERY Right    arthroscopic, remote past    Family History  Problem Relation Age of Onset   Hypertension Mother    Heart disease Father    Congestive Heart Failure Father    Cancer Father        pancreatic   Diabetes Father    Cataracts Father    Hypertension Sister    Hypertension Brother    Hypertension Brother    Congestive Heart Failure Brother      Current Outpatient Medications:    fluconazole (DIFLUCAN) 100 MG tablet, Take 1 tablet by mouth daily for 1 week, then 1 tablet weekly, Disp: 12 tablet, Rfl: 0   folic acid (FOLVITE) 1 MG tablet, Take by mouth., Disp: , Rfl:    hydrochlorothiazide (MICROZIDE) 12.5 MG capsule, TAKE 1 CAPSULE BY MOUTH ONCE A DAY, Disp: 90 capsule, Rfl: 1   LORazepam (ATIVAN) 2 MG tablet, Take 2 mg by mouth daily as needed for anxiety or seizure., Disp: , Rfl:    nystatin (MYCOSTATIN/NYSTOP) powder, Apply 1 application topically 2 (two) times daily., Disp: 60 g, Rfl: 0   phenytoin (DILANTIN) 200 MG ER capsule, Take 1 capsule (200 mg total) by mouth 2 (two) times daily, Disp: 180 capsule, Rfl: 3   rosuvastatin (CRESTOR) 10 MG tablet, Take 1 tablet (10 mg total) by mouth daily., Disp: 90 tablet, Rfl: 3   topiramate (TOPAMAX) 100 MG tablet, Take 1 tablet (100 mg total) by mouth 2 (two) times daily, Disp:  180 tablet, Rfl: 3   lisinopril (ZESTRIL) 20 MG tablet, TAKE 1 TABLET BY MOUTH TWO TIMES DAILY, Disp: 180 tablet, Rfl: 3  No Known Allergies   Review of Systems Constitutional: -fever, -chills, -sweats, -unexpected weight change, -decreased appetite, -fatigue Allergy: -sneezing, -itching, -congestion Dermatology: -changing moles, --rash, -lumps ENT: -runny nose, -ear pain, -sore throat, -hoarseness, -sinus pain, -teeth pain, - ringing in ears, -hearing loss, -nosebleeds Cardiology: -chest pain, -palpitations, -swelling, -difficulty breathing when lying flat, -waking up short of breath Respiratory: -cough, -shortness of breath, -difficulty breathing with exercise or exertion, -wheezing, -coughing up blood Gastroenterology: -abdominal pain, -nausea, -vomiting, -diarrhea, -constipation, -blood in stool, -changes in bowel movement, -difficulty swallowing or eating Hematology: -bleeding, -bruising  Musculoskeletal: -joint aches, -muscle aches, -joint swelling, -back pain, -neck pain, -cramping, -changes in gait Ophthalmology: denies vision changes, eye redness, itching, discharge Urology: -burning with urination, -difficulty urinating, -blood in urine, -urinary frequency, -urgency, -incontinence Neurology: -headache, -weakness, -tingling, -numbness, -memory loss, -falls, -dizziness Psychology: -depressed mood, -agitation, -sleep problems Male GU: no testicular mass, pain, no lymph nodes swollen, no swelling, no rash.  Depression screen [facility] 2/9 07/17/2021 11/01/2020 04/25/2020 04/25/2020 10/25/2019  Decreased Interest 0 0 0 0 0  Down, Depressed, Hopeless 0 0 0 0 0  PHQ - 2 Score 0  0 0 0 0        Objective:  BP 120/74    Pulse 74    Temp 98.2 F (36.8 C)    Ht 5\' 11"  (1.803 m)    Wt 271 lb 3.2 oz (123 kg)    BMI 37.82 kg/m   BP Readings from Last 3 Encounters:  07/17/21 120/74  05/31/21 126/75  05/21/21 120/80   Wt Readings from Last 3 Encounters:  07/17/21 271 lb 3.2 oz (123 kg)   05/31/21 271 lb 12.8 oz (123.3 kg)  05/21/21 272 lb 3.2 oz (123.5 kg)    General appearance: alert, no distress, WD/WN, African American male Skin: large area of discoloration, pink/brown of bilat intertriginous regions of left and right inguinal area suggestive of tina HEENT: normocephalic, conjunctiva/corneas normal, sclerae anicteric, PERRLA, EOMi, nares patent, no discharge or erythema, pharynx normal Oral cavity: MMM, tongue normal, teeth - missing some upper molars bilat, several areas of tooth decay Neck: supple, no lymphadenopathy, no thyromegaly, no masses, normal ROM, no bruits Chest: non tender, normal shape and expansion Heart: RRR, normal S1, S2, no murmurs Lungs: CTA bilaterally, no wheezes, rhonchi, or rales Abdomen: +bs, soft, non tender, non distended, no masses, no hepatomegaly, no splenomegaly, no bruits Back: non tender, normal ROM, no scoliosis Musculoskeletal: upper extremities non tender, no obvious deformity, normal ROM throughout, lower extremities non tender, no obvious deformity, normal ROM throughout Extremities: no edema, no cyanosis, no clubbing Pulses: 2+ symmetric, upper and lower extremities, normal cap refill Neurological: alert, oriented x 3, CN2-12 intact, strength normal upper extremities and lower extremities, sensation normal throughout, DTRs 2+ throughout, no cerebellar signs, gait normal Psychiatric: normal affect, behavior normal, pleasant  GU: normal male external genitalia,circumcised, nontender, no masses, no hernia, no lymphadenopathy Rectal: anus somewhat reduced tone, prostate mildly enlarged   Assessment and Plan :   Encounter Diagnoses  Name Primary?   Encounter for health maintenance examination in adult Yes   Screening for prostate cancer    Screen for colon cancer    Primary hypertension    Mixed hyperlipidemia    Fam hx-ischem heart disease    BMI 37.0-37.9, adult    Anemia, unspecified type    Vaccine counseling    History  of sleep apnea    Screening for diabetes mellitus    Essential hypertension    Seizure disorder (HCC)    Rash    Tinea cruris    Need for COVID-19 vaccine     This visit was a preventative care visit, also known as wellness visit or routine physical.   Topics typically include healthy lifestyle, diet, exercise, preventative care, vaccinations, sick and well care, proper use of emergency dept and after hours care, as well as other concerns.     I reviewed comprehensive labs that were done through [facility] in care everywhere including CMET, CBC, Lipid, PSA, all within last 3 months  Recommendations: Continue to return yearly for your annual wellness and preventative care visits.  This gives 04-13-1969 a chance to discuss healthy lifestyle, exercise, vaccinations, review your chart record, and perform screenings where appropriate.  I recommend you see your eye doctor yearly for routine vision care.  I recommend you see your dentist yearly for routine dental care including hygiene visits twice yearly.   Vaccination recommendations were reviewed Immunization History  Administered Date(s) Administered   Influenza Split 04/13/2021   Influenza,inj,Quad PF,6+ Mos 05/24/2018, 04/01/2019, 04/18/2020   Janssen (J&J) SARS-COV-2 Vaccination 02/14/2020   PFIZER(Purple Top)SARS-COV-2  Vaccination 07/31/2020   Pfizer Covid-19 Vaccine Bivalent Booster 49yrs & up 07/17/2021   Tdap 02/27/2016   He notes covid boosters elsewhere   Counseled on the Covid virus vaccine.  Vaccine information sheet given.  Covid vaccine given after consent obtained.  Shingles vaccine:  I recommend you have a shingles vaccine to help prevent shingles or herpes zoster outbreak.   Please call your insurer to inquire about coverage for the Shingrix vaccine given in 2 doses.   Some insurers cover this vaccine after age 69, some cover this after age 57.  If your insurer covers this, then call to schedule appointment to have this vaccine  here.   Screening for cancer: Colon cancer screening: Cologuard negative 10/2019.   Plan repeat 10/2022  We discussed PSA, prostate exam, and prostate cancer screening risks/benefits.   Recent PSA 05/2021 normal  Skin cancer screening: Check your skin regularly for new changes, growing lesions, or other lesions of concern Come in for evaluation if you have skin lesions of concern.  Lung cancer screening: If you have a greater than 20 pack year history of tobacco use, then you may qualify for lung cancer screening with a chest CT scan.   Please call your insurance company to inquire about coverage for this test.  We currently don't have screenings for other cancers besides breast, cervical, colon, and lung cancers.  If you have a strong family history of cancer or have other cancer screening concerns, please let me know.    Bone health: Get at least 150 minutes of aerobic exercise weekly Get weight bearing exercise at least once weekly Bone density test:  A bone density test is an imaging test that uses a type of X-ray to measure the amount of calcium and other minerals in your bones. The test may be used to diagnose or screen you for a condition that causes weak or thin bones (osteoporosis), predict your risk for a broken bone (fracture), or determine how well your osteoporosis treatment is working. The bone density test is recommended for females 65 and older, or females or males <65 if certain risk factors such as thyroid disease, long term use of steroids such as for asthma or rheumatological issues, vitamin D deficiency, estrogen deficiency, family history of osteoporosis, self or family history of fragility fracture in first degree relative.  Check insurance on coverage since you have high risk medications that increase risk for osteoporosis.    Heart health: Get at least 150 minutes of aerobic exercise weekly Limit alcohol It is important to maintain a healthy blood pressure and  healthy cholesterol numbers  Heart disease screening: Screening for heart disease includes screening for blood pressure, fasting lipids, glucose/diabetes screening, BMI height to weight ratio, reviewed of smoking status, physical activity, and diet.    Goals include blood pressure 120/80 or less, maintaining a healthy lipid/cholesterol profile, preventing diabetes or keeping diabetes numbers under good control, not smoking or using tobacco products, exercising most days per week or at least 150 minutes per week of exercise, and eating healthy variety of fruits and vegetables, healthy oils, and avoiding unhealthy food choices like fried food, fast food, high sugar and high cholesterol foods.     I reviewed recent cardiology notes from Dr. Dietrich Pates, normal echo, and he plans to do CT coronary score in January.    Medical care options: I recommend you continue to seek care here first for routine care.  We try really hard to have available appointments Monday  through Friday daytime hours for sick visits, acute visits, and physicals.  Urgent care should be used for after hours and weekends for significant issues that cannot wait till the next day.  The emergency department should be used for significant potentially life-threatening emergencies.  The emergency department is expensive, can often have long wait times for less significant concerns, so try to utilize primary care, urgent care, or telemedicine when possible to avoid unnecessary trips to the emergency department.  Virtual visits and telemedicine have been introduced since the pandemic started in 2020, and can be convenient ways to receive medical care.  We offer virtual appointments as well to assist you in a variety of options to seek medical care.   Advanced Directives: I recommend you consider completing a Health Care Power of Attorney and Living Will.   These documents respect your wishes and help alleviate burdens on your loved ones if  you were to become terminally ill or be in a position to need those documents enforced.    You can complete Advanced Directives yourself, have them notarized, then have copies made for our office, for you and for anybody you feel should have them in safe keeping.  Or, you can have an attorney prepare these documents.   If you haven't updated your Last Will and Testament in a while, it may be worthwhile having an attorney prepare these documents together and save on some costs.       Separate significant issues discussed: Hypertension - continue current medication  Hyperlipidemia - continue statin Recommendations for improving lipids:  Foods TO AVOID or limit - fried foods, high sugar foods, white bread, enriched flour, fast food, red meat, large amounts of cheese, processed foods such as little debbie cakes, cookies, pies, donuts, for example  Foods TO INCLUDE in the diet - whole grains such as whole grain pasta, whole grain bread, barley, steel cut oatmeal (not instant oatmeal), avocado, fish, green leafy vegetables, nuts, increased fiber in diet, and using olive oil in small amounts for cooking or as salad dressing vinaigrette.    Obesity - continue efforts to lose weight through health diet and exercise  Anemia, mild - additional labs today  Seizure disorder - managed by neurology, compliant with medication  Eyelid lesion - follow up with ophthalmology as planned  Rash, tinea cruris - begin topical nystatin powder and oral diflucan as below   Major was seen today for annual exam.  Diagnoses and all orders for this visit:  Encounter for health maintenance examination in adult -     CBC -     Iron, TIBC and Ferritin Panel -     Hemoglobin A1c -     Urinalysis  Screening for prostate cancer  Screen for colon cancer  Primary hypertension  Mixed hyperlipidemia  Fam hx-ischem heart disease  BMI 37.0-37.9, adult  Anemia, unspecified type -     CBC -     Iron, TIBC and  Ferritin Panel  Vaccine counseling  History of sleep apnea  Screening for diabetes mellitus -     Hemoglobin A1c  Essential hypertension -     lisinopril (ZESTRIL) 20 MG tablet; TAKE 1 TABLET BY MOUTH TWO TIMES DAILY  Seizure disorder (HCC)  Rash  Tinea cruris  Need for COVID-19 vaccine -     [employer]  Other orders -     nystatin (MYCOSTATIN/NYSTOP) powder; Apply 1 application topically 2 (two) times daily. -     fluconazole (  DIFLUCAN) 100 MG tablet; Take 1 tablet by mouth daily for 1 week, then 1 tablet weekly    Follow-up pending labs, yearly for physical

## 2021-07-17 NOTE — Patient Instructions (Signed)
This visit was a preventative care visit, also known as wellness visit or routine physical.   Topics typically include healthy lifestyle, diet, exercise, preventative care, vaccinations, sick and well care, proper use of emergency dept and after hours care, as well as other concerns.     I reviewed comprehensive labs that were done through [facility] in care everywhere including CMET, CBC, Lipid, PSA, all within last 3 months  Recommendations: Continue to return yearly for your annual wellness and preventative care visits.  This gives [place] a chance to discuss healthy lifestyle, exercise, vaccinations, review your chart record, and perform screenings where appropriate.  I recommend you see your eye doctor yearly for routine vision care.  I recommend you see your dentist yearly for routine dental care including hygiene visits twice yearly.   Vaccination recommendations were reviewed Immunization History  Administered Date(s) Administered   Influenza Split 04/13/2021   Influenza,inj,Quad PF,6+ Mos 05/24/2018, 04/01/2019, 04/18/2020   Janssen (J&J) SARS-COV-2 Vaccination 02/14/2020   Tdap 02/27/2016   He notes covid boosters elsewhere   Counseled on the Covid virus vaccine.  Vaccine information sheet given.  Covid vaccine given after consent obtained.  Shingles vaccine:  I recommend you have a shingles vaccine to help prevent shingles or herpes zoster outbreak.   Please call your insurer to inquire about coverage for the Shingrix vaccine given in 2 doses.   Some insurers cover this vaccine after age 36, some cover this after age 62.  If your insurer covers this, then call to schedule appointment to have this vaccine here.   Screening for cancer: Colon cancer screening: Cologuard negative 10/2019.   Plan repeat 10/2022  We discussed PSA, prostate exam, and prostate cancer screening risks/benefits.   Recent PSA 05/2021 normal  Skin cancer screening: Check your skin regularly for new changes,  growing lesions, or other lesions of concern Come in for evaluation if you have skin lesions of concern.  Lung cancer screening: If you have a greater than 20 pack year history of tobacco use, then you may qualify for lung cancer screening with a chest CT scan.   Please call your insurance company to inquire about coverage for this test.  We currently don't have screenings for other cancers besides breast, cervical, colon, and lung cancers.  If you have a strong family history of cancer or have other cancer screening concerns, please let me know.    Bone health: Get at least 150 minutes of aerobic exercise weekly Get weight bearing exercise at least once weekly Bone density test:  A bone density test is an imaging test that uses a type of X-ray to measure the amount of calcium and other minerals in your bones. The test may be used to diagnose or screen you for a condition that causes weak or thin bones (osteoporosis), predict your risk for a broken bone (fracture), or determine how well your osteoporosis treatment is working. The bone density test is recommended for females 65 and older, or females or males <65 if certain risk factors such as thyroid disease, long term use of steroids such as for asthma or rheumatological issues, vitamin D deficiency, estrogen deficiency, family history of osteoporosis, self or family history of fragility fracture in first degree relative.  Check insurance on coverage since you have high risk medications that increase risk for osteoporosis.    Heart health: Get at least 150 minutes of aerobic exercise weekly Limit alcohol It is important to maintain a healthy blood pressure and healthy cholesterol  numbers  Heart disease screening: Screening for heart disease includes screening for blood pressure, fasting lipids, glucose/diabetes screening, BMI height to weight ratio, reviewed of smoking status, physical activity, and diet.    Goals include blood pressure  120/80 or less, maintaining a healthy lipid/cholesterol profile, preventing diabetes or keeping diabetes numbers under good control, not smoking or using tobacco products, exercising most days per week or at least 150 minutes per week of exercise, and eating healthy variety of fruits and vegetables, healthy oils, and avoiding unhealthy food choices like fried food, fast food, high sugar and high cholesterol foods.     I reviewed recent cardiology notes from Dr. Dietrich Pates, normal echo, and he plans to do CT coronary score in January.    Medical care options: I recommend you continue to seek care here first for routine care.  We try really hard to have available appointments Monday through Friday daytime hours for sick visits, acute visits, and physicals.  Urgent care should be used for after hours and weekends for significant issues that cannot wait till the next day.  The emergency department should be used for significant potentially life-threatening emergencies.  The emergency department is expensive, can often have long wait times for less significant concerns, so try to utilize primary care, urgent care, or telemedicine when possible to avoid unnecessary trips to the emergency department.  Virtual visits and telemedicine have been introduced since the pandemic started in 2020, and can be convenient ways to receive medical care.  We offer virtual appointments as well to assist you in a variety of options to seek medical care.   Advanced Directives: I recommend you consider completing a Health Care Power of Attorney and Living Will.   These documents respect your wishes and help alleviate burdens on your loved ones if you were to become terminally ill or be in a position to need those documents enforced.    You can complete Advanced Directives yourself, have them notarized, then have copies made for our office, for you and for anybody you feel should have them in safe keeping.  Or, you can have an  attorney prepare these documents.   If you haven't updated your Last Will and Testament in a while, it may be worthwhile having an attorney prepare these documents together and save on some costs.       Separate significant issues discussed: Hypertension - continue current medication  Hyperlipidemia - continue statin Recommendations for improving lipids:  Foods TO AVOID or limit - fried foods, high sugar foods, white bread, enriched flour, fast food, red meat, large amounts of cheese, processed foods such as little debbie cakes, cookies, pies, donuts, for example  Foods TO INCLUDE in the diet - whole grains such as whole grain pasta, whole grain bread, barley, steel cut oatmeal (not instant oatmeal), avocado, fish, green leafy vegetables, nuts, increased fiber in diet, and using olive oil in small amounts for cooking or as salad dressing vinaigrette.    Obesity - continue efforts to lose weight through health diet and exercise  Anemia, mild - additional labs today  Seizure disorder - managed by neurology, compliant with medication  Eyelid lesion - follow up with ophthalmology as planned  Rash, tinea cruris - begin topical nystatin powder and oral diflucan as below

## 2021-07-18 LAB — CBC
Hematocrit: 45 % (ref 37.5–51.0)
Hemoglobin: 14.6 g/dL (ref 13.0–17.7)
MCH: 28.5 pg (ref 26.6–33.0)
MCHC: 32.4 g/dL (ref 31.5–35.7)
MCV: 88 fL (ref 79–97)
Platelets: 354 10*3/uL (ref 150–450)
RBC: 5.12 x10E6/uL (ref 4.14–5.80)
RDW: 12.6 % (ref 11.6–15.4)
WBC: 11.3 10*3/uL — ABNORMAL HIGH (ref 3.4–10.8)

## 2021-07-18 LAB — HEMOGLOBIN A1C
Est. average glucose Bld gHb Est-mCnc: 126 mg/dL
Hgb A1c MFr Bld: 6 % — ABNORMAL HIGH (ref 4.8–5.6)

## 2021-07-18 LAB — URINALYSIS
Bilirubin, UA: NEGATIVE
Glucose, UA: NEGATIVE
Ketones, UA: NEGATIVE
Leukocytes,UA: NEGATIVE
Nitrite, UA: NEGATIVE
Protein,UA: NEGATIVE
RBC, UA: NEGATIVE
Specific Gravity, UA: 1.016 (ref 1.005–1.030)
Urobilinogen, Ur: 0.2 mg/dL (ref 0.2–1.0)
pH, UA: 6.5 (ref 5.0–7.5)

## 2021-07-18 LAB — IRON,TIBC AND FERRITIN PANEL
Ferritin: 235 ng/mL (ref 30–400)
Iron Saturation: 23 % (ref 15–55)
Iron: 59 ug/dL (ref 38–169)
Total Iron Binding Capacity: 262 ug/dL (ref 250–450)
UIBC: 203 ug/dL (ref 111–343)

## 2021-07-22 ENCOUNTER — Other Ambulatory Visit (HOSPITAL_COMMUNITY): Payer: Self-pay

## 2021-07-26 ENCOUNTER — Telehealth: Payer: Self-pay | Admitting: Internal Medicine

## 2021-07-26 ENCOUNTER — Telehealth: Payer: Self-pay

## 2021-07-26 NOTE — Telephone Encounter (Signed)
The patient has been notified of the result and verbalized understanding.  All questions (if any) were answered. He agreed to having a Ca Score and reports that he recently saw his PCP Dr. Aleen Campi and his BP has been "normal". He will continue to monitor.

## 2021-07-26 NOTE — Telephone Encounter (Signed)
PT IS RETURNING CALL TO ANN IN TRIAGE FROM 07/25/21

## 2021-07-26 NOTE — Telephone Encounter (Signed)
-----   Message from Dietrich Pates V, MD sent at 06/25/2021 10:15 PM EST ----- Echo is normal    Normal pumping function of the heart   Normal wall thickness.  Normal valve function Recomm:   Keep track of BP Continue to track down hx on family members who died Discussed CT calcium score ($95)   It would say does [t have coronary artery disease   Help for risk factor managment

## 2021-07-26 NOTE — Telephone Encounter (Signed)
Spoke with the pt.. see result encounter. Pt to have a calcium score.

## 2021-07-30 ENCOUNTER — Other Ambulatory Visit: Payer: Self-pay | Admitting: *Deleted

## 2021-07-30 DIAGNOSIS — E785 Hyperlipidemia, unspecified: Secondary | ICD-10-CM

## 2021-07-30 DIAGNOSIS — I10 Essential (primary) hypertension: Secondary | ICD-10-CM

## 2021-07-30 DIAGNOSIS — Z8249 Family history of ischemic heart disease and other diseases of the circulatory system: Secondary | ICD-10-CM

## 2021-07-30 DIAGNOSIS — R9431 Abnormal electrocardiogram [ECG] [EKG]: Secondary | ICD-10-CM

## 2021-07-30 NOTE — Progress Notes (Signed)
Cristian Riffle, MD  06/25/2021 10:15 PM EST     Echo is normal    Normal pumping function of the heart   Normal wall thickness.  Normal valve function Recomm:   Keep track of BP Continue to track down hx on family members who died Discussed CT calcium score ($95)   It would say does [t have coronary artery disease   Help for risk factor managment

## 2021-08-07 ENCOUNTER — Other Ambulatory Visit: Payer: Self-pay | Admitting: Neurology

## 2021-08-07 ENCOUNTER — Other Ambulatory Visit (HOSPITAL_COMMUNITY): Payer: Self-pay

## 2021-08-08 ENCOUNTER — Other Ambulatory Visit (HOSPITAL_COMMUNITY): Payer: Self-pay

## 2021-08-08 MED ORDER — PHENYTOIN SODIUM EXTENDED 200 MG PO CAPS
ORAL_CAPSULE | ORAL | 3 refills | Status: DC
  Filled 2021-08-08: qty 180, 90d supply, fill #0

## 2021-08-09 ENCOUNTER — Other Ambulatory Visit (HOSPITAL_COMMUNITY): Payer: Self-pay

## 2021-08-09 ENCOUNTER — Telehealth: Payer: Self-pay

## 2021-08-09 ENCOUNTER — Other Ambulatory Visit: Payer: Self-pay | Admitting: Neurology

## 2021-08-09 MED ORDER — PHENYTOIN SODIUM EXTENDED 100 MG PO CAPS
ORAL_CAPSULE | ORAL | 3 refills | Status: DC
  Filled 2021-08-09: qty 360, 90d supply, fill #0
  Filled 2021-11-14: qty 360, 90d supply, fill #1
  Filled 2022-02-06: qty 360, 90d supply, fill #2
  Filled 2022-05-07: qty 360, 90d supply, fill #3

## 2021-08-09 NOTE — Telephone Encounter (Signed)
Pt called an informed Phenytoin has changed to 100mg : Take 2 caps BID? If he has any side effects like dizziness, double vision, pls have him call and we will check his Dilantin level

## 2021-08-12 ENCOUNTER — Other Ambulatory Visit (HOSPITAL_COMMUNITY): Payer: Self-pay

## 2021-08-13 ENCOUNTER — Other Ambulatory Visit (HOSPITAL_COMMUNITY): Payer: Self-pay

## 2021-08-23 ENCOUNTER — Other Ambulatory Visit (HOSPITAL_COMMUNITY): Payer: Self-pay

## 2021-09-03 ENCOUNTER — Other Ambulatory Visit: Payer: Self-pay

## 2021-09-03 ENCOUNTER — Ambulatory Visit
Admission: RE | Admit: 2021-09-03 | Discharge: 2021-09-03 | Disposition: A | Discharge status: Home / Self Care | Payer: Self-pay | Source: Ambulatory Visit | Attending: Internal Medicine | Admitting: Internal Medicine

## 2021-09-03 DIAGNOSIS — E785 Hyperlipidemia, unspecified: Secondary | ICD-10-CM

## 2021-09-03 DIAGNOSIS — R9431 Abnormal electrocardiogram [ECG] [EKG]: Secondary | ICD-10-CM

## 2021-09-03 DIAGNOSIS — Z8249 Family history of ischemic heart disease and other diseases of the circulatory system: Secondary | ICD-10-CM

## 2021-09-03 DIAGNOSIS — I10 Essential (primary) hypertension: Secondary | ICD-10-CM

## 2021-09-06 ENCOUNTER — Telehealth: Payer: Self-pay | Admitting: Internal Medicine

## 2021-09-06 NOTE — Telephone Encounter (Signed)
Attempted phone call to pt.  OK per Epic to leave detailed message.  Pt advised RN does not see a phone call to pt 09/05/2021.  CT results have not yet been reviewed by Dr Tenny Craw.  Will contact pt once the CT has been resulted.  Pt may contact [phone] for further questions or concerns.

## 2021-09-06 NOTE — Telephone Encounter (Signed)
Follow Up:      Patient is returning call from yesterday, concerning hi CT results.

## 2021-09-11 ENCOUNTER — Other Ambulatory Visit (HOSPITAL_COMMUNITY): Payer: Self-pay

## 2021-09-16 ENCOUNTER — Other Ambulatory Visit: Payer: No Typology Code available for payment source

## 2021-09-18 ENCOUNTER — Telehealth: Payer: Self-pay

## 2021-09-18 ENCOUNTER — Other Ambulatory Visit (HOSPITAL_COMMUNITY): Payer: Self-pay

## 2021-09-18 DIAGNOSIS — Z79899 Other long term (current) drug therapy: Secondary | ICD-10-CM

## 2021-09-18 DIAGNOSIS — Z8249 Family history of ischemic heart disease and other diseases of the circulatory system: Secondary | ICD-10-CM

## 2021-09-18 DIAGNOSIS — E785 Hyperlipidemia, unspecified: Secondary | ICD-10-CM

## 2021-09-18 MED ORDER — ROSUVASTATIN CALCIUM 40 MG PO TABS
40.0000 mg | ORAL_TABLET | Freq: Every day | ORAL | 3 refills | Status: DC
  Filled 2021-09-18: qty 90, 90d supply, fill #0
  Filled 2021-12-07: qty 90, 90d supply, fill #1
  Filled 2022-03-05: qty 90, 90d supply, fill #2
  Filled 2022-06-04: qty 90, 90d supply, fill #3

## 2021-09-18 NOTE — Telephone Encounter (Signed)
Sent a message to the pts My Chart re: his Calcium Score... new orders placed.  ?

## 2021-09-18 NOTE — Telephone Encounter (Signed)
-----   Message from Fay Records, MD sent at 09/08/2021 10:06 PM EST ----- ?Pt's CT shows very mild plaquing o the coronary arteries ?Calcium score is 40 (85% percentitle or age) ?He should be on a higher dose o crestor to get LDL down  LDL should be 70 or less ?I would increase Crestor to 40 mg  ?F/U lipomed, Lpa and Apo B in 8 wks  ?

## 2021-10-16 ENCOUNTER — Ambulatory Visit (INDEPENDENT_AMBULATORY_CARE_PROVIDER_SITE_OTHER): Payer: No Typology Code available for payment source | Admitting: Medical

## 2021-10-16 ENCOUNTER — Other Ambulatory Visit: Payer: Self-pay

## 2021-10-16 VITALS — BP 120/80 | HR 85 | Wt 269.4 lb

## 2021-10-16 DIAGNOSIS — I2510 Atherosclerotic heart disease of native coronary artery without angina pectoris: Secondary | ICD-10-CM | POA: Diagnosis not present

## 2021-10-16 DIAGNOSIS — D72829 Elevated white blood cell count, unspecified: Secondary | ICD-10-CM

## 2021-10-16 DIAGNOSIS — Z6837 Body mass index (BMI) 37.0-37.9, adult: Secondary | ICD-10-CM | POA: Diagnosis not present

## 2021-10-16 DIAGNOSIS — R7301 Impaired fasting glucose: Secondary | ICD-10-CM | POA: Insufficient documentation

## 2021-10-16 NOTE — Progress Notes (Signed)
Subjective: ? Cristian Ortiz. is a 55 y.o. male who presents for ?Chief Complaint  ?Patient presents with  ? 3 month follow-up  ?  3 month follow-up on weight loss, white blood count and prediabetes  ?   ?Here to follow-up on issues from his December 2022 physical ? ?We started low-dose Crestor in December after getting his labs back.  He had a CT coronary calcium score in February that showed mild CAD.  Cardiology increased him to 40 mg Crestor at that time and he has about for 5 more weeks before he goes back in for a fasting lipid panel.  He is tolerating the Crestor fine without side effect.  He is taking this at night ? ?Last visit his hemoglobin A1c is borderline.  He has made dietary changes and is exercising.  He has not lost a lot of weight yet but he has made changes including cutting out sugar, changing to whole wheat bread, small portions.  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 ? ?He is getting his Shingrix vaccine through my health at work soon  ? ?In December his labs showed prediabetes.  Been using some apple cider vinegar at night and tumeric with some knee pains occasionally.  He notes since last visit here in 06/2021, has been walking more, has been trying to limit sugars and unhealthy food.    ? ?Schedule has been a little crazy with people being out of work which has interfered with exercise.    ? ?No other aggravating or relieving factors.   ? ?No other c/o. ? ?The following portions of the patient's history were reviewed and updated as appropriate: allergies, current medications, past family history, past medical history, past social history, past surgical history and problem list. ? ?ROS ?Otherwise as in subjective above ? ? ?Objective: ?BP 120/80   Pulse 85   Wt 269 lb 6.4 oz (122.2 kg)   BMI 37.57 kg/m?  ? ?Wt Readings from Last 3 Encounters:  ?10/16/21 269 lb 6.4 oz (122.2 kg)  ?07/17/21 271 lb 3.2 oz (123 kg)  ?05/31/21 271 lb 12.8 oz (123.3 kg)   ? ?General appearance: alert, no distress, well developed, well nourished ? ? ? ?Assessment: ?Encounter Diagnoses  ?Name Primary?  ? Impaired fasting blood sugar Yes  ? BMI 37.0-37.9, adult   ? Leukocytosis, unspecified type   ? Coronary artery calcification seen on CT scan   ? ? ? ?Plan: ?Impaired glucose-counseled on diet and exercise.  He is already made some lifestyle changes.  We also discussed possibly adding Wegovy medication to help with weight loss and impaired glucose.  He does not want to pursue this at this moment.  He will continue to work on lifestyle changes.  Plan to recheck hemoglobin A1c in June appointment ? ?BMI greater than 37-continue efforts to lose weight through healthy diet and exercise.  We spent some time talking about strategies with diet and exercise, adding some weightbearing and more moderate intense exercise. ? ?Leukocytosis-we discussed possible causes.  His white count was mildly elevated in December.  We will monitor this and plan to recheck in June.  He has no concerning symptoms at this time ? ?CAD seen on recent CT coronary calcium score in February 2023, mild-I started him on low-dose Crestor in December.  Cardiology recently increased from 40 mg.  He has follow-up planned with them for fasting labs in another 4 to 5 weeks ? ? ?  Cristian Ortiz was seen today for 3 month follow-up. ? ?Diagnoses and all orders for this visit: ? ?Impaired fasting blood sugar ? ?BMI 37.0-37.9, adult ? ?Leukocytosis, unspecified type ? ?Coronary artery calcification seen on CT scan ? ? ? ?Follow up: June 2023 as planned ?

## 2021-10-16 NOTE — Patient Instructions (Signed)

## 2021-11-11 ENCOUNTER — Other Ambulatory Visit (HOSPITAL_COMMUNITY): Payer: Self-pay

## 2021-11-15 ENCOUNTER — Other Ambulatory Visit (HOSPITAL_COMMUNITY): Payer: Self-pay

## 2021-11-20 ENCOUNTER — Other Ambulatory Visit: Payer: No Typology Code available for payment source

## 2021-11-27 ENCOUNTER — Other Ambulatory Visit: Payer: No Typology Code available for payment source | Admitting: *Deleted

## 2021-11-27 DIAGNOSIS — Z8249 Family history of ischemic heart disease and other diseases of the circulatory system: Secondary | ICD-10-CM

## 2021-11-27 DIAGNOSIS — E785 Hyperlipidemia, unspecified: Secondary | ICD-10-CM

## 2021-11-27 DIAGNOSIS — Z79899 Other long term (current) drug therapy: Secondary | ICD-10-CM

## 2021-11-29 LAB — NMR, LIPOPROFILE
Cholesterol, Total: 129 mg/dL (ref 100–199)
HDL Particle Number: 30.4 umol/L — ABNORMAL LOW (ref >=30.5)
HDL-C: 51 mg/dL (ref >39)
LDL Particle Number: 866 nmol/L (ref <1000)
LDL Size: 20.6 nm (ref >20.5)
LDL-C (NIH Calc): 68 mg/dL (ref 0–99)
LP-IR Score: 26 (ref <=45)
Small LDL Particle Number: 437 nmol/L (ref <=527)
Triglycerides: 40 mg/dL (ref 0–149)

## 2021-11-29 LAB — LIPOPROTEIN A (LPA): Lipoprotein (a): 92.8 nmol/L — ABNORMAL HIGH (ref <75.0)

## 2021-11-29 LAB — APOLIPOPROTEIN B: Apolipoprotein B: 66 mg/dL (ref <90)

## 2021-12-07 ENCOUNTER — Other Ambulatory Visit (HOSPITAL_COMMUNITY): Payer: Self-pay

## 2021-12-07 ENCOUNTER — Other Ambulatory Visit: Payer: Self-pay | Admitting: Medical

## 2021-12-07 DIAGNOSIS — I10 Essential (primary) hypertension: Secondary | ICD-10-CM

## 2021-12-09 ENCOUNTER — Other Ambulatory Visit (HOSPITAL_COMMUNITY): Payer: Self-pay

## 2021-12-09 MED ORDER — HYDROCHLOROTHIAZIDE 12.5 MG PO CAPS
ORAL_CAPSULE | Freq: Every day | ORAL | 1 refills | Status: DC
  Filled 2021-12-09: qty 90, 90d supply, fill #0
  Filled 2022-03-05: qty 90, 90d supply, fill #1

## 2022-01-15 ENCOUNTER — Encounter: Payer: No Typology Code available for payment source | Admitting: Medical

## 2022-01-28 ENCOUNTER — Ambulatory Visit
Admission: RE | Admit: 2022-01-28 | Discharge: 2022-01-28 | Disposition: A | Discharge status: Home / Self Care | Payer: No Typology Code available for payment source | Source: Ambulatory Visit | Attending: Neurology | Admitting: Neurology

## 2022-01-28 ENCOUNTER — Telehealth: Payer: Self-pay

## 2022-01-28 DIAGNOSIS — G40909 Epilepsy, unspecified, not intractable, without status epilepticus: Secondary | ICD-10-CM

## 2022-01-28 DIAGNOSIS — Z79899 Other long term (current) drug therapy: Secondary | ICD-10-CM

## 2022-01-28 NOTE — Telephone Encounter (Signed)
Pt called an informed that bone density scan is normal. Continue with monitoring vitamin D levels with PCP. Bone density scan to be done every 3 years

## 2022-01-28 NOTE — Telephone Encounter (Signed)
-----   Message from Van Clines, MD sent at 01/28/2022  9:01 AM EDT ----- Pls let him know the bone density scan is normal. Continue with monitoring vitamin D levels with PCP. Bone density scan to be done every 3 years. Thanks

## 2022-01-29 ENCOUNTER — Ambulatory Visit (INDEPENDENT_AMBULATORY_CARE_PROVIDER_SITE_OTHER): Payer: No Typology Code available for payment source | Admitting: Medical

## 2022-01-29 ENCOUNTER — Encounter: Payer: Self-pay | Admitting: Medical

## 2022-01-29 VITALS — BP 120/70 | HR 71 | Wt 264.8 lb

## 2022-01-29 DIAGNOSIS — R29898 Other symptoms and signs involving the musculoskeletal system: Secondary | ICD-10-CM

## 2022-01-29 DIAGNOSIS — E782 Mixed hyperlipidemia: Secondary | ICD-10-CM

## 2022-01-29 DIAGNOSIS — M65331 Trigger finger, right middle finger: Secondary | ICD-10-CM | POA: Diagnosis not present

## 2022-01-29 DIAGNOSIS — I10 Essential (primary) hypertension: Secondary | ICD-10-CM | POA: Diagnosis not present

## 2022-01-29 DIAGNOSIS — Z79899 Other long term (current) drug therapy: Secondary | ICD-10-CM

## 2022-01-29 DIAGNOSIS — R7301 Impaired fasting glucose: Secondary | ICD-10-CM | POA: Diagnosis not present

## 2022-01-29 DIAGNOSIS — I2510 Atherosclerotic heart disease of native coronary artery without angina pectoris: Secondary | ICD-10-CM

## 2022-01-29 DIAGNOSIS — D72829 Elevated white blood cell count, unspecified: Secondary | ICD-10-CM

## 2022-01-29 DIAGNOSIS — G40909 Epilepsy, unspecified, not intractable, without status epilepticus: Secondary | ICD-10-CM

## 2022-01-29 LAB — POCT GLYCOSYLATED HEMOGLOBIN (HGB A1C): Hemoglobin A1C: 5.8 % — AB (ref 4.0–5.6)

## 2022-01-29 NOTE — Assessment & Plan Note (Signed)
He had slight elevation of the white cells back in December.  Given his current right hand issues it would probably be less viable to recheck the CBC at this time.  We will defer this for now.  He has no other signs of illness or sickness or fever.

## 2022-01-29 NOTE — Assessment & Plan Note (Signed)
Compliant with therapy, managed by neurology 

## 2022-01-29 NOTE — Assessment & Plan Note (Signed)
I recommend he begin over-the-counter reinforced wrist splint at nighttime for the right hand.  Referral to orthopedics for further evaluation and treatment recommendations

## 2022-01-29 NOTE — Assessment & Plan Note (Signed)
Recent lipid profile done in May at goal.  Continue current therapy

## 2022-01-29 NOTE — Progress Notes (Signed)
Subjective:  Cristian Ortiz. is a 56 y.o. male who presents for Chief Complaint  Patient presents with   Medication Refill    Medcheck. Pt also has pain in his right had that he thinks may be carpal tunnel.     Patient Care Team: Macguire Holsinger, Cleda Mccreedy as PCP - General (Family Medicine) Van Clines, MD as Consulting Physician (Neurology) Sees dentist Sees eye doctor Dr. Dietrich Pates, cardiology  Here for recheck.  I saw him in December 2022 for a physical.  At that time he had impaired glucose.  Here to follow-up on impaired glucose.  He has been working on getting exercise regularly and eating healthier since December.  He is eating more fruits, keto type diet.  He was exercising regularly but he has been working a lot of hours recently.  Due to this time a year and people been on vacation he is having to pull extra shifts.  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  He is working several days in a row including getting ready to do a 13-day stent.  This is limiting his exercise right now.  He denies polyuria, polydipsia, blurred vision.  No recent significant weight changes unexpected  His main concern today is his right hand.  Over the past few weeks he notes some weakness in the right hand in general and has trouble closing all of his fingers at times.  He does have a trigger finger of the right middle finger.  He is right-handed.  No specific numbness swelling or pain though more weakness than anything.  He had a bone density test yesterday that was reportedly normal  He is compliant with his medications for blood pressure, cholesterol, vitamin D supplement and his seizure medicine.  No other aggravating or relieving factors.    No other c/o.  Past Medical History:  Diagnosis Date   Hyperlipidemia    Hypertension    Seizures (HCC)    Current Outpatient Medications on File Prior to Visit  Medication Sig Dispense Refill   folic acid (FOLVITE) 1 MG tablet  Take by mouth.     hydrochlorothiazide (MICROZIDE) 12.5 MG capsule TAKE 1 CAPSULE BY MOUTH ONCE A DAY 90 capsule 1   lisinopril (ZESTRIL) 20 MG tablet TAKE 1 TABLET BY MOUTH TWO TIMES DAILY 180 tablet 3   LORazepam (ATIVAN) 2 MG tablet Take 2 mg by mouth daily as needed for anxiety or seizure.     nystatin (MYCOSTATIN/NYSTOP) powder Apply 1 application topically 2 (two) times daily. 60 g 0   phenytoin (DILANTIN) 100 MG ER capsule Take 2 capsules (200mg ) twice a day 360 capsule 3   rosuvastatin (CRESTOR) 40 MG tablet Take 1 tablet (40 mg total) by mouth daily. 90 tablet 3   topiramate (TOPAMAX) 100 MG tablet Take 1 tablet (100 mg total) by mouth 2 (two) times daily 180 tablet 3   Vitamin D, Ergocalciferol, (DRISDOL) 1.25 MG (50000 UNIT) CAPS capsule Take 50,000 Units by mouth every 7 (seven) days.     No current facility-administered medications on file prior to visit.     The following portions of the patient's history were reviewed and updated as appropriate: allergies, current medications, past family history, past medical history, past social history, past surgical history and problem list.  ROS Otherwise as in subjective above  Objective: BP 120/70   Pulse 71   Wt 264 lb 12.8 oz (120.1 kg)   SpO2 97%   BMI  36.93 kg/m   General appearance: alert, no distress, well developed, well nourished Right hand with tenderness over the third digit at the MCPs volar surface, otherwise hand fingers and wrist nontender, range of motion is normal Overall his grip strength on the right is definitely decreased compared to the left, there is some trigger action of the right middle finger with flexion of the finger, but sensation seems to be normal.  Wrist strength seems to be relatively normal.  Normal strength in general with finger flexion, extension of all fingers on the right seems a little decreased Right hand vascularly intact Negative Phalen's and Tinel's Ext: no  edema   Assessment: Encounter Diagnoses  Name Primary?   Impaired fasting blood sugar Yes   Trigger middle finger of right hand    Hand weakness    Primary hypertension    Coronary artery calcification seen on CT scan    Nocturnal epilepsy (HCC)    Leukocytosis, unspecified type    Mixed hyperlipidemia    High risk medication use      Plan: Problem List Items Addressed This Visit     Hyperlipidemia    Recent lipid profile done in May at goal.  Continue current therapy      Hypertension    At goal, compliant with current medication      Nocturnal epilepsy (HCC)    Compliant with therapy, managed by neurology      Impaired fasting blood sugar - Primary    Updated hemoglobin A1c today.  He was prediabetic as of December 2022.  He is working on healthy lifestyle and has lost some weight since December.  I encouraged him to continue with healthy low-fat low sugar diet and regular exercise.      Relevant Orders   HgB A1c (Completed)   Leukocytosis    He had slight elevation of the white cells back in December.  Given his current right hand issues it would probably be less viable to recheck the CBC at this time.  We will defer this for now.  He has no other signs of illness or sickness or fever.      Coronary artery calcification seen on CT scan    I reviewed back over his CT coronary test that was abnormal showing some mild CAD back in February of this year.  He just had a recent lipid profile in May 2023.  He will continue his current statin and work on low-cholesterol diet      Trigger middle finger of right hand    Referral to hand surgery      Relevant Orders   Ambulatory referral to Hand Surgery   Hand weakness    I recommend he begin over-the-counter reinforced wrist splint at nighttime for the right hand.  Referral to orthopedics for further evaluation and treatment recommendations      Relevant Orders   Ambulatory referral to Hand Surgery   High risk  medication use    Follow up: 06/2022

## 2022-01-29 NOTE — Assessment & Plan Note (Signed)
Updated hemoglobin A1c today.  He was prediabetic as of December 2022.  He is working on healthy lifestyle and has lost some weight since December.  I encouraged him to continue with healthy low-fat low sugar diet and regular exercise.

## 2022-01-29 NOTE — Assessment & Plan Note (Signed)
I reviewed back over his CT coronary test that was abnormal showing some mild CAD back in February of this year.  He just had a recent lipid profile in May 2023.  He will continue his current statin and work on low-cholesterol diet

## 2022-01-29 NOTE — Assessment & Plan Note (Signed)
Referral to hand surgery

## 2022-01-29 NOTE — Assessment & Plan Note (Signed)
At goal, compliant with current medication

## 2022-02-03 ENCOUNTER — Other Ambulatory Visit (HOSPITAL_COMMUNITY): Payer: Self-pay

## 2022-02-06 ENCOUNTER — Other Ambulatory Visit (HOSPITAL_COMMUNITY): Payer: Self-pay

## 2022-02-07 ENCOUNTER — Other Ambulatory Visit (HOSPITAL_COMMUNITY): Payer: Self-pay

## 2022-03-05 ENCOUNTER — Other Ambulatory Visit (HOSPITAL_COMMUNITY): Payer: Self-pay

## 2022-03-26 ENCOUNTER — Encounter: Payer: Self-pay | Admitting: Internal Medicine

## 2022-04-29 ENCOUNTER — Encounter: Payer: Self-pay | Admitting: Internal Medicine

## 2022-05-04 ENCOUNTER — Other Ambulatory Visit: Payer: Self-pay | Admitting: Neurology

## 2022-05-05 ENCOUNTER — Other Ambulatory Visit (HOSPITAL_COMMUNITY): Payer: Self-pay

## 2022-05-05 MED ORDER — TOPIRAMATE 100 MG PO TABS
100.0000 mg | ORAL_TABLET | Freq: Two times a day (BID) | ORAL | 3 refills | Status: DC
Start: 2022-05-05 — End: 2022-05-09
  Filled 2022-05-05: qty 180, 90d supply, fill #0

## 2022-05-07 ENCOUNTER — Other Ambulatory Visit (HOSPITAL_COMMUNITY): Payer: Self-pay

## 2022-05-08 ENCOUNTER — Other Ambulatory Visit (HOSPITAL_COMMUNITY): Payer: Self-pay

## 2022-05-09 ENCOUNTER — Encounter: Payer: Self-pay | Admitting: Neurology

## 2022-05-09 ENCOUNTER — Ambulatory Visit (INDEPENDENT_AMBULATORY_CARE_PROVIDER_SITE_OTHER): Payer: No Typology Code available for payment source | Admitting: Neurology

## 2022-05-09 ENCOUNTER — Other Ambulatory Visit (HOSPITAL_COMMUNITY): Payer: Self-pay

## 2022-05-09 VITALS — BP 109/70 | HR 80 | Ht 72.0 in | Wt 279.6 lb

## 2022-05-09 DIAGNOSIS — G40909 Epilepsy, unspecified, not intractable, without status epilepticus: Secondary | ICD-10-CM | POA: Diagnosis not present

## 2022-05-09 MED ORDER — TOPIRAMATE 100 MG PO TABS
100.0000 mg | ORAL_TABLET | Freq: Two times a day (BID) | ORAL | 4 refills | Status: DC
  Filled 2022-05-09 – 2022-08-02 (×2): qty 180, 90d supply, fill #0
  Filled 2022-10-28: qty 180, 90d supply, fill #1
  Filled 2023-02-06: qty 180, 90d supply, fill #2
  Filled 2023-05-08: qty 180, 90d supply, fill #3

## 2022-05-09 MED ORDER — PHENYTOIN SODIUM EXTENDED 100 MG PO CAPS
200.0000 mg | ORAL_CAPSULE | Freq: Two times a day (BID) | ORAL | 4 refills | Status: DC
Start: 2022-05-09 — End: 2023-05-12
  Filled 2022-05-09: qty 360, 90d supply, fill #0
  Filled 2022-08-05: qty 360, 90d supply, fill #1
  Filled 2022-10-28: qty 360, 90d supply, fill #2
  Filled 2023-02-06: qty 360, 90d supply, fill #3
  Filled 2023-05-08: qty 360, 90d supply, fill #4

## 2022-05-09 NOTE — Patient Instructions (Signed)
Good to see you!  Continue Dilantin 100mg : take 2 capsules twice a day  2. Continue Topiramate 100mg : take 1 tablet twice a day  3. Follow-up in 1 year, call for any changes   Seizure Precautions: 1. If medication has been prescribed for you to prevent seizures, take it exactly as directed.  Do not stop taking the medicine without talking to your doctor first, even if you have not had a seizure in a long time.   2. Avoid activities in which a seizure would cause danger to yourself or to others.  Don't operate dangerous machinery, swim alone, or climb in high or dangerous places, such as on ladders, roofs, or girders.  Do not drive unless your doctor says you may.  3. If you have any warning that you may have a seizure, lay down in a safe place where you can't hurt yourself.    4.  No driving for 6 months from last seizure, as per [facility]   Please refer to the following link on the [place] website for more information: http://[email or web address].org/answerplace/Social/driving/drivingu.cfm   5.  Maintain good sleep hygiene. Avoid alcohol.  6.  Contact your doctor if you have any problems that may be related to the medicine you are taking.  7.  Call 911 and bring the patient back to the ED if:        A.  The seizure lasts longer than 5 minutes.       B.  The patient doesn't awaken shortly after the seizure  C.  The patient has new problems such as difficulty seeing, speaking or moving  D.  The patient was injured during the seizure  E.  The patient has a temperature over 102 F (39C)  F.  The patient vomited and now is having trouble breathing

## 2022-05-09 NOTE — Progress Notes (Signed)
NEUROLOGY FOLLOW UP OFFICE NOTE  Cristian Ortiz [id number] 1966/01/28  HISTORY OF PRESENT ILLNESS: I had the pleasure of seeing Cristian Ortiz in follow-up in the neurology clinic on 05/09/2022.  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 He is alone in the office today. Records and images were personally reviewed where available.  Bone density scan in 01/2022 was normal. Since his last visit, he continues to do well seizure-free since 2019 (due to missed medication). He is on Phenytoin 200mg  BID (100mg  2 caps BID) and Topiramate 100mg  BID without side effects. He denies any staring/unresponsive episodes, gaps in time, olfactory/gustatory hallucinations, focal numbness/tingling/weakness, myoclonic jerks. No headaches, dizziness, diplopia, no falls. He had a right trigger finger injection a few months ago which resolved the issue. He is sleeping better with his work schedule. Mood is good.    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 Records from his neurologist Dr. Manuella Ortiz were reviewed. He was last seen by Dr. Manuella Ortiz in 02/2021. Per notes, first seizure occurred in 2000. He was started on Dilantin and went seizure-free for a year until he had a seizure and dislocated his right shoulder. There is note of a history of status epilepticus in 2004. He reports his seizures are exclusively nocturnal. Notes indicate that his wife has reported he would be staring, unresponsive with lip smacking/chewing movements, trying to get up. He would be confused for 1-2 hours after, agitated. He has had bowel incontinence and tongue bite with the seizures, no focal weakness. There is a note of generalized convulsions as well. They usually occur 30-60 minutes after sleep. Per notes, MRI and EEG in 2004 were done,  results unavailable for review. His last seizure was in 09/2017 in the setting of missed medication. He has been on Topiramate 100mg  BID and Phenytoin 200mg  BID for many years without side effects. His wife has Ativan by her bedside to administer, but has not needed it as he has been doing well. He denies any olfactory/gustatory hallucinations, deja vu, rising epigastric sensation, focal numbness/tingling/weakness, myoclonic jerks. He denies any headaches, dizziness, vision changes, neck/back pain, bowel/bladder dysfunction.He has recently switched his work schedule and this is helping with his sleep, mood, and memory. He is compliant with medications and has a pillbox. No falls.   Epilepsy Risk Factors:  He had a normal birth and early development.  There is no history of febrile convulsions, CNS infections such as meningitis/encephalitis, significant traumatic brain injury, neurosurgical procedures, or family history of seizures.  Laboratory Data:  02/2021:  Dilantin level 8 Vitamin D normal 37.7  Diagnostic Data: No prior EEG or MRI available for review    PAST MEDICAL HISTORY: Past Medical History:  Diagnosis Date   Hyperlipidemia    Hypertension    Seizures ([place])     MEDICATIONS: Current Outpatient Medications on File Prior to Visit  Medication Sig Dispense Refill   Ascorbic Acid (VITAMIN C) 1000 MG tablet Take 1,000 mg by mouth daily.     folic acid (FOLVITE) 1 MG tablet Take by mouth.     hydrochlorothiazide (MICROZIDE) 12.5 MG capsule TAKE 1 CAPSULE BY MOUTH ONCE A DAY 90 capsule 1   lisinopril (ZESTRIL) 20 MG tablet TAKE 1 TABLET BY MOUTH TWO TIMES DAILY 180 tablet 3   LORazepam (ATIVAN) 2 MG tablet Take 2 mg  by mouth daily as needed for anxiety or seizure.     Multiple Vitamins-Minerals (MULTIVITAMIN WITH MINERALS) tablet Take 1 tablet by mouth daily.     nystatin (MYCOSTATIN/NYSTOP) powder Apply 1 application topically 2 (two) times daily. 60 g 0   phenytoin (DILANTIN) 100 MG  ER capsule Take 2 capsules (200mg ) twice a day 360 capsule 3   rosuvastatin (CRESTOR) 40 MG tablet Take 1 tablet (40 mg total) by mouth daily. 90 tablet 3   topiramate (TOPAMAX) 100 MG tablet Take 1 tablet (100 mg total) by mouth 2 (two) times daily 180 tablet 3   Vitamin D, Ergocalciferol, (DRISDOL) 1.25 MG (50000 UNIT) CAPS capsule Take 50,000 Units by mouth every 7 (seven) days.     No current facility-administered medications on file prior to visit.    ALLERGIES: No Known Allergies  FAMILY HISTORY: Family History  Problem Relation Age of Onset   Hypertension Mother    Heart disease Father    Congestive Heart Failure Father    Cancer Father        pancreatic   Diabetes Father    Cataracts Father    Hypertension Sister    Hypertension Brother    Hypertension Brother    Congestive Heart Failure Brother     SOCIAL HISTORY: Social History   Socioeconomic History   Marital status: Married    Spouse name: Not on file   Number of children: Not on file   Years of education: Not on file   Highest education level: Not on file  Occupational History   Not on file  Tobacco Use   Smoking status: Never   Smokeless tobacco: Never  Vaping Use   Vaping Use: Never used  Substance and Sexual Activity   Alcohol use: No   Drug use: No   Sexual activity: Yes    Partners: Female  Other Topics Concern   Not on file  Social History Narrative   Right handed    Works as Primary school teacher with [employer]   Married   No children.     Exercise - walk   06/2021   Social Determinants of Health   Financial Resource Strain: Not on file  Food Insecurity: Not on file  Transportation Needs: Not on file  Physical Activity: Not on file  Stress: Not on file  Social Connections: Not on file  Intimate Partner Violence: Not on file     PHYSICAL EXAM: Vitals:   05/09/22 0829  BP: 109/70  Pulse: 80  SpO2: 99%   General: No acute distress Head:   Normocephalic/atraumatic Skin/Extremities: No rash, no edema Neurological Exam: alert and awake. No aphasia or dysarthria. Fund of knowledge is appropriate.  Attention and concentration are normal.   Cranial nerves: Pupils equal, round. Extraocular movements intact with no nystagmus. Visual fields full.  No facial asymmetry.  Motor: Bulk and tone normal, muscle strength 5/5 throughout with no pronator drift.   Finger to nose testing intact.  Gait narrow-based and steady, able to tandem walk adequately.  Romberg negative.   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 He continues to be seizure-free since 09/2017 (due to missed medication) on Topiramate 100mg  BID and Phenytoin 200mg  BID, refills sent. Bone density scan 01/2021 normal, recommend bone density scan every 3 years. He is aware of [place] driving laws to stop driving after a seizure until 6 months seizure-free. Follow-up in 1 year,  call for any changes.    Thank you for allowing me to participate in his care.  Please do not hesitate to call for any questions or concerns.    Ellouise Newer, M.D.   CC: Chana Bode, PA-C

## 2022-06-04 ENCOUNTER — Other Ambulatory Visit: Payer: Self-pay | Admitting: Medical

## 2022-06-04 ENCOUNTER — Other Ambulatory Visit (HOSPITAL_COMMUNITY): Payer: Self-pay

## 2022-06-04 DIAGNOSIS — I10 Essential (primary) hypertension: Secondary | ICD-10-CM

## 2022-06-04 MED ORDER — HYDROCHLOROTHIAZIDE 12.5 MG PO CAPS
ORAL_CAPSULE | Freq: Every day | ORAL | 0 refills | Status: DC
  Filled 2022-06-04: qty 90, 90d supply, fill #0

## 2022-06-04 NOTE — Telephone Encounter (Signed)
Has an appt in [place]

## 2022-07-22 ENCOUNTER — Other Ambulatory Visit: Payer: Self-pay

## 2022-07-22 ENCOUNTER — Encounter: Payer: Self-pay | Admitting: Medical

## 2022-07-22 ENCOUNTER — Ambulatory Visit (INDEPENDENT_AMBULATORY_CARE_PROVIDER_SITE_OTHER): Payer: No Typology Code available for payment source | Admitting: Medical

## 2022-07-22 VITALS — BP 124/80 | HR 68 | Temp 98.3°F | Ht 70.5 in | Wt 286.8 lb

## 2022-07-22 DIAGNOSIS — Z125 Encounter for screening for malignant neoplasm of prostate: Secondary | ICD-10-CM | POA: Diagnosis not present

## 2022-07-22 DIAGNOSIS — I2510 Atherosclerotic heart disease of native coronary artery without angina pectoris: Secondary | ICD-10-CM

## 2022-07-22 DIAGNOSIS — R21 Rash and other nonspecific skin eruption: Secondary | ICD-10-CM

## 2022-07-22 DIAGNOSIS — J309 Allergic rhinitis, unspecified: Secondary | ICD-10-CM

## 2022-07-22 DIAGNOSIS — G40909 Epilepsy, unspecified, not intractable, without status epilepticus: Secondary | ICD-10-CM

## 2022-07-22 DIAGNOSIS — Z1211 Encounter for screening for malignant neoplasm of colon: Secondary | ICD-10-CM

## 2022-07-22 DIAGNOSIS — Z8249 Family history of ischemic heart disease and other diseases of the circulatory system: Secondary | ICD-10-CM | POA: Diagnosis not present

## 2022-07-22 DIAGNOSIS — I700 Atherosclerosis of aorta: Secondary | ICD-10-CM

## 2022-07-22 DIAGNOSIS — I10 Essential (primary) hypertension: Secondary | ICD-10-CM

## 2022-07-22 DIAGNOSIS — Z7185 Encounter for immunization safety counseling: Secondary | ICD-10-CM | POA: Diagnosis not present

## 2022-07-22 DIAGNOSIS — Z23 Encounter for immunization: Secondary | ICD-10-CM | POA: Diagnosis not present

## 2022-07-22 DIAGNOSIS — R7301 Impaired fasting glucose: Secondary | ICD-10-CM

## 2022-07-22 DIAGNOSIS — Z0000 Encounter for general adult medical examination without abnormal findings: Secondary | ICD-10-CM

## 2022-07-22 DIAGNOSIS — E782 Mixed hyperlipidemia: Secondary | ICD-10-CM

## 2022-07-22 LAB — CBC WITH DIFFERENTIAL/PLATELET
Basophils Absolute: 0.1 10*3/uL (ref 0.0–0.2)
Basos: 1 %
EOS (ABSOLUTE): 0.4 10*3/uL (ref 0.0–0.4)
Eos: 4 %
Hematocrit: 42.1 % (ref 37.5–51.0)
Hemoglobin: 14.1 g/dL (ref 13.0–17.7)
Immature Grans (Abs): 0 10*3/uL (ref 0.0–0.1)
Immature Granulocytes: 0 %
Lymphocytes Absolute: 1.7 10*3/uL (ref 0.7–3.1)
Lymphs: 16 %
MCH: 28.7 pg (ref 26.6–33.0)
MCHC: 33.5 g/dL (ref 31.5–35.7)
MCV: 86 fL (ref 79–97)
Monocytes Absolute: 0.7 10*3/uL (ref 0.1–0.9)
Monocytes: 7 %
Neutrophils Absolute: 7.6 10*3/uL — ABNORMAL HIGH (ref 1.4–7.0)
Neutrophils: 72 %
Platelets: 329 10*3/uL (ref 150–450)
RBC: 4.92 x10E6/uL (ref 4.14–5.80)
RDW: 12.3 % (ref 11.6–15.4)
WBC: 10.6 10*3/uL (ref 3.4–10.8)

## 2022-07-22 LAB — POCT URINALYSIS DIP (PROADVANTAGE DEVICE)
Bilirubin, UA: NEGATIVE
Blood, UA: NEGATIVE
Glucose, UA: NEGATIVE mg/dL
Ketones, POC UA: NEGATIVE mg/dL
Leukocytes, UA: NEGATIVE
Nitrite, UA: NEGATIVE
Protein Ur, POC: NEGATIVE mg/dL
Specific Gravity, Urine: 1.02
Urobilinogen, Ur: 0.2
pH, UA: 6 (ref 5.0–8.0)

## 2022-07-22 MED ORDER — CLOTRIMAZOLE-BETAMETHASONE 1-0.05 % EX CREA
1.0000 | TOPICAL_CREAM | Freq: Every day | CUTANEOUS | 0 refills | Status: DC
  Filled 2022-07-22: qty 45, 30d supply, fill #0

## 2022-07-22 MED ORDER — FLUCONAZOLE 150 MG PO TABS
150.0000 mg | ORAL_TABLET | ORAL | 0 refills | Status: DC
  Filled 2022-07-22: qty 3, 21d supply, fill #0

## 2022-07-22 NOTE — Progress Notes (Signed)
Subjective:   HPI  Cristian Ortiz. is a 57 y.o. male who presents for Chief Complaint  Patient presents with   Annual Exam    CPE fasting labs no other issues     Patient Care Team: Kalissa Grays, Cleda Mccreedy as PCP - General (Family Medicine) Van Clines, MD as Consulting Physician (Neurology) Sees dentist Sees eye doctor Dr. Dietrich Pates, cardiology   Concerns: No new issues.  Still has ongoing rash on right thigh  for months.  Uses powder but rash doesn't resolve  Saw ortho recent for trigger finger, steroid injections in right hand done.  Compliant with medications in general  Got flu shot at work recently, [facility]  Reviewed their medical, surgical, family, social, medication, and allergy history and updated chart as appropriate.  Past Medical History:  Diagnosis Date   Hyperlipidemia    Hypertension    Seizures ([facility])     Past Surgical History:  Procedure Laterality Date   KNEE SURGERY Right    arthroscopic, remote past    Family History  Problem Relation Age of Onset   Hypertension Mother    Heart disease Father    Congestive Heart Failure Father    Cancer Father        pancreatic   Diabetes Father    Cataracts Father    Hypertension Sister    Hypertension Brother    Hypertension Brother    Congestive Heart Failure Brother      Current Outpatient Medications:    Ascorbic Acid (VITAMIN C) 1000 MG tablet, Take 1,000 mg by mouth daily., Disp: , Rfl:    clotrimazole-betamethasone (LOTRISONE) cream, Apply 1 Application topically daily., Disp: 45 g, Rfl: 0   fluconazole (DIFLUCAN) 150 MG tablet, Take 1 tablet (150 mg total) by mouth once a week., Disp: 3 tablet, Rfl: 0   folic acid (FOLVITE) 1 MG tablet, Take by mouth., Disp: , Rfl:    hydrochlorothiazide (MICROZIDE) 12.5 MG capsule, TAKE 1 CAPSULE BY MOUTH ONCE A DAY, Disp: 90 capsule, Rfl: 0   lisinopril (ZESTRIL) 20 MG tablet, TAKE 1 TABLET BY MOUTH TWO TIMES DAILY, Disp: 180 tablet, Rfl: 3    Multiple Vitamins-Minerals (MULTIVITAMIN WITH MINERALS) tablet, Take 1 tablet by mouth daily., Disp: , Rfl:    nystatin (MYCOSTATIN/NYSTOP) powder, Apply 1 application topically 2 (two) times daily., Disp: 60 g, Rfl: 0   phenytoin (DILANTIN) 100 MG ER capsule, Take 2 capsules (200 mg total) by mouth 2 (two) times daily., Disp: 360 capsule, Rfl: 4   rosuvastatin (CRESTOR) 40 MG tablet, Take 1 tablet (40 mg total) by mouth daily., Disp: 90 tablet, Rfl: 3   topiramate (TOPAMAX) 100 MG tablet, Take 1 tablet (100 mg total) by mouth 2 (two) times daily, Disp: 180 tablet, Rfl: 4   Vitamin D, Ergocalciferol, (DRISDOL) 1.25 MG (50000 UNIT) CAPS capsule, Take 50,000 Units by mouth every 7 (seven) days., Disp: , Rfl:    LORazepam (ATIVAN) 2 MG tablet, Take 2 mg by mouth daily as needed for anxiety or seizure. (Patient not taking: Reported on 07/22/2022), Disp: , Rfl:   No Known Allergies  Review of Systems  Constitutional:  Negative for chills, fever, malaise/fatigue and weight loss.  HENT:  Negative for congestion, ear pain, hearing loss, sore throat and tinnitus.   Eyes:  Negative for blurred vision, pain and redness.  Respiratory:  Negative for cough, hemoptysis and shortness of breath.   Cardiovascular:  Negative for chest pain, palpitations, orthopnea, claudication and leg  swelling.  Gastrointestinal:  Negative for abdominal pain, blood in stool, constipation, diarrhea, nausea and vomiting.  Genitourinary:  Negative for dysuria, flank pain, frequency, hematuria and urgency.  Musculoskeletal:  Negative for falls, joint pain and myalgias.  Skin:  Positive for rash. Negative for itching.  Neurological:  Negative for dizziness, tingling, speech change, weakness and headaches.  Endo/Heme/Allergies:  Negative for polydipsia. Does not bruise/bleed easily.  Psychiatric/Behavioral:  Negative for depression and memory loss. The patient is not nervous/anxious and does not have insomnia.         07/22/2022     8:31 AM 10/16/2021    9:19 AM 07/17/2021   10:26 AM 11/01/2020   10:39 AM 04/25/2020   10:09 AM  Depression screen PHQ 2/9  Decreased Interest 0 0 0 0 0  Down, Depressed, Hopeless 0 0 0 0 0  PHQ - 2 Score 0 0 0 0 0        Objective:  BP 124/80   Pulse 68   Temp 98.3 F (36.8 C)   Ht 5' 10.5" (1.791 m)   Wt 286 lb 12.8 oz (130.1 kg)   BMI 40.57 kg/m   BP Readings from Last 3 Encounters:  07/22/22 124/80  05/09/22 109/70  01/29/22 120/70   Wt Readings from Last 3 Encounters:  07/22/22 286 lb 12.8 oz (130.1 kg)  05/09/22 279 lb 9.6 oz (126.8 kg)  01/29/22 264 lb 12.8 oz (120.1 kg)    General appearance: alert, no distress, WD/WN, African American male Skin: large area of discoloration, pink/brown of bilat intertriginous regions of right inguinal, lateral hip and right buttock area suggestive of tina vs other process HEENT: normocephalic, conjunctiva/corneas normal, sclerae anicteric, PERRLA, EOMi, nares patent, no discharge or erythema, pharynx normal Oral cavity: MMM, tongue normal Neck: supple, no lymphadenopathy, no thyromegaly, no masses, normal ROM, no bruits Chest: non tender, normal shape and expansion Heart: RRR, normal S1, S2, no murmurs Lungs: CTA bilaterally, no wheezes, rhonchi, or rales Abdomen: +bs, soft, non tender, non distended, no masses, no hepatomegaly, no splenomegaly, no bruits Back: non tender, normal ROM, no scoliosis Musculoskeletal: upper extremities non tender, no obvious deformity, normal ROM throughout, lower extremities non tender, no obvious deformity, normal ROM throughout Extremities: no edema, no cyanosis, no clubbing Pulses: 2+ symmetric, upper and lower extremities, normal cap refill Neurological: alert, oriented x 3, CN2-12 intact, strength normal upper extremities and lower extremities, sensation normal throughout, DTRs 2+ throughout, no cerebellar signs, gait normal Psychiatric: normal affect, behavior normal, pleasant  GU: normal male  external genitalia,circumcised, nontender, no masses, no hernia, no lymphadenopathy Rectal: anus somewhat reduced tone, prostate mildly enlarged   Assessment and Plan :   Encounter Diagnoses  Name Primary?   Encounter for health maintenance examination in adult Yes   Primary hypertension    Coronary artery calcification seen on CT scan    Impaired fasting blood sugar    Vaccine counseling    Screening for prostate cancer    Mixed hyperlipidemia    Fam hx-ischem heart disease    Seizure disorder ([place])    Allergic rhinitis, unspecified seasonality, unspecified trigger    Screen for colon cancer    Rash    Need for shingles vaccine    Aortic atherosclerosis ([place])     This visit was a preventative care visit, also known as wellness visit or routine physical.      Recommendations: Continue to return yearly for your annual wellness and preventative care visits.  This gives  [place] a chance to discuss healthy lifestyle, exercise, vaccinations, review your chart record, and perform screenings where appropriate.  I recommend you see your eye doctor yearly for routine vision care.  I recommend you see your dentist yearly for routine dental care including hygiene visits twice yearly.   Vaccination recommendations were reviewed Immunization History  Administered Date(s) Administered   Influenza Split 04/13/2021   Influenza,inj,Quad PF,6+ Mos 05/24/2018, 04/01/2019, 04/18/2020   Janssen (J&J) SARS-COV-2 Vaccination 02/14/2020   PFIZER(Purple Top)SARS-COV-2 Vaccination 07/31/2020   Pfizer Covid-19 Vaccine Bivalent Booster 19yrs & up 07/17/2021   Tdap 02/27/2016   Zoster Recombinat (Shingrix) 07/22/2022   Counseled on the Shingrix vaccine.  Vaccine information sheet given. Shingrix #1 vaccine given after consent obtained.   Return in 2 months for Shingrix #2.    Screening for cancer: Colon cancer screening: Cologuard negative 10/2019.   Plan repeat 10/2022  We discussed PSA, prostate  exam, and prostate cancer screening risks/benefits.   Recent PSA 05/2021 normal  Skin cancer screening: Check your skin regularly for new changes, growing lesions, or other lesions of concern Come in for evaluation if you have skin lesions of concern.  Lung cancer screening: If you have a greater than 20 pack year history of tobacco use, then you may qualify for lung cancer screening with a chest CT scan.   Please call your insurance company to inquire about coverage for this test.  We currently don't have screenings for other cancers besides breast, cervical, colon, and lung cancers.  If you have a strong family history of cancer or have other cancer screening concerns, please let me know.    Bone health: Get at least 150 minutes of aerobic exercise weekly Get weight bearing exercise at least once weekly Bone density test:  A bone density test is an imaging test that uses a type of X-ray to measure the amount of calcium and other minerals in your bones. The test may be used to diagnose or screen you for a condition that causes weak or thin bones (osteoporosis), predict your risk for a broken bone (fracture), or determine how well your osteoporosis treatment is working. The bone density test is recommended for females 65 and older, or females or males <65 if certain risk factors such as thyroid disease, long term use of steroids such as for asthma or rheumatological issues, vitamin D deficiency, estrogen deficiency, family history of osteoporosis, self or family history of fragility fracture in first degree relative.  Check insurance on coverage since you have high risk medications that increase risk for osteoporosis.    Heart health: Get at least 150 minutes of aerobic exercise weekly Limit alcohol It is important to maintain a healthy blood pressure and healthy cholesterol numbers  Heart disease screening: Screening for heart disease includes screening for blood pressure, fasting  lipids, glucose/diabetes screening, BMI height to weight ratio, reviewed of smoking status, physical activity, and diet.    Goals include blood pressure 120/80 or less, maintaining a healthy lipid/cholesterol profile, preventing diabetes or keeping diabetes numbers under good control, not smoking or using tobacco products, exercising most days per week or at least 150 minutes per week of exercise, and eating healthy variety of fruits and vegetables, healthy oils, and avoiding unhealthy food choices like fried food, fast food, high sugar and high cholesterol foods.     CT cardiac score 09/03/21 IMPRESSION: Coronary calcium score of 40. This was 85th percentile for age-, race-, and sex-matched controls.   Aortic atherosclerosis.  Echocardiogram 06/25/2021  IMPRESSIONS   1. Left ventricular ejection fraction, by estimation, is 60 to 65%. The  left ventricle has normal function. The left ventricle has no regional  wall motion abnormalities. Left ventricular diastolic parameters were  normal. The average left ventricular  global longitudinal strain is -24.2 %. The global longitudinal strain is  normal.   2. Right ventricular systolic function is normal. The right ventricular  size is normal.   3. The mitral valve is normal in structure. Trivial mitral valve  regurgitation. No evidence of mitral stenosis.   4. The aortic valve is tricuspid. Aortic valve regurgitation is not  visualized. No aortic stenosis is present.   5. The inferior vena cava is normal in size with greater than 50%  respiratory variability, suggesting right atrial pressure of 3 mmHg.   Comparison(s): No prior Echocardiogram.     Medical care options: I recommend you continue to seek care here first for routine care.  We try really hard to have available appointments Monday through Friday daytime hours for sick visits, acute visits, and physicals.  Urgent care should be used for after hours and weekends for significant  issues that cannot wait till the next day.  The emergency department should be used for significant potentially life-threatening emergencies.  The emergency department is expensive, can often have long wait times for less significant concerns, so try to utilize primary care, urgent care, or telemedicine when possible to avoid unnecessary trips to the emergency department.  Virtual visits and telemedicine have been introduced since the pandemic started in 2020, and can be convenient ways to receive medical care.  We offer virtual appointments as well to assist you in a variety of options to seek medical care.   Advanced Directives: I recommend you consider completing a Teller and Living Will.   These documents respect your wishes and help alleviate burdens on your loved ones if you were to become terminally ill or be in a position to need those documents enforced.    You can complete Advanced Directives yourself, have them notarized, then have copies made for our office, for you and for anybody you feel should have them in safe keeping.  Or, you can have an attorney prepare these documents.   If you haven't updated your Last Will and Testament in a while, it may be worthwhile having an attorney prepare these documents together and save on some costs.       Separate significant issues discussed: Hypertension - continue current medication  Hyperlipidemia - continue statin  Obesity - continue efforts to lose weight through health diet and exercise  Seizure disorder - managed by neurology, compliant with medication  Rash, tinea cruris - begin topical Lotrisone and oral diflucan as below, referral to dermatology as well     Cristian Ortiz was seen today for annual exam.  Diagnoses and all orders for this visit:  Encounter for health maintenance examination in adult -     Comprehensive metabolic panel -     Cancel: CBC -     Hemoglobin A1c -     PSA -     POCT Urinalysis DIP  (Proadvantage Device) -     CBC with Differential/Platelet -     Zoster Recombinant (Shingrix )  Primary hypertension  Coronary artery calcification seen on CT scan  Impaired fasting blood sugar -     Hemoglobin A1c  Vaccine counseling  Screening for prostate cancer -  PSA  Mixed hyperlipidemia  Fam hx-ischem heart disease  Seizure disorder (HCC)  Allergic rhinitis, unspecified seasonality, unspecified trigger  Screen for colon cancer  Rash -     Ambulatory referral to Dermatology  Need for shingles vaccine  Aortic atherosclerosis ([facility]CC)  Other orders -     fluconazole (DIFLUCAN) 150 MG tablet; Take 1 tablet (150 mg total) by mouth once a week. -     clotrimazole-betamethasone (LOTRISONE) cream; Apply 1 Application topically daily.    Follow-up pending labs, yearly for physical

## 2022-07-22 NOTE — Patient Instructions (Signed)
This visit was a preventative care visit, also known as wellness visit or routine physical.      Recommendations: Continue to return yearly for your annual wellness and preventative care visits.  This gives [place] a chance to discuss healthy lifestyle, exercise, vaccinations, review your chart record, and perform screenings where appropriate.  I recommend you see your eye doctor yearly for routine vision care.  I recommend you see your dentist yearly for routine dental care including hygiene visits twice yearly.   Vaccination recommendations were reviewed Immunization History  Administered Date(s) Administered   Influenza Split 04/13/2021   Influenza,inj,Quad PF,6+ Mos 05/24/2018, 04/01/2019, 04/18/2020   Janssen (J&J) SARS-COV-2 Vaccination 02/14/2020   PFIZER(Purple Top)SARS-COV-2 Vaccination 07/31/2020   Pfizer Covid-19 Vaccine Bivalent Booster 47yrs & up 07/17/2021   Tdap 02/27/2016   Counseled on the Shingrix vaccine.  Vaccine information sheet given. Shingrix #1 vaccine given after consent obtained.   Return in 2 months for Shingrix #2.    Screening for cancer: Colon cancer screening: Cologuard negative 10/2019.   Plan repeat 10/2022  We discussed PSA, prostate exam, and prostate cancer screening risks/benefits.   Recent PSA 05/2021 normal  Skin cancer screening: Check your skin regularly for new changes, growing lesions, or other lesions of concern Come in for evaluation if you have skin lesions of concern.  Lung cancer screening: If you have a greater than 20 pack year history of tobacco use, then you may qualify for lung cancer screening with a chest CT scan.   Please call your insurance company to inquire about coverage for this test.  We currently don't have screenings for other cancers besides breast, cervical, colon, and lung cancers.  If you have a strong family history of cancer or have other cancer screening concerns, please let me know.    Bone health: Get at  least 150 minutes of aerobic exercise weekly Get weight bearing exercise at least once weekly Bone density test:  A bone density test is an imaging test that uses a type of X-ray to measure the amount of calcium and other minerals in your bones. The test may be used to diagnose or screen you for a condition that causes weak or thin bones (osteoporosis), predict your risk for a broken bone (fracture), or determine how well your osteoporosis treatment is working. The bone density test is recommended for females 33 and older, or females or males <36 if certain risk factors such as thyroid disease, long term use of steroids such as for asthma or rheumatological issues, vitamin D deficiency, estrogen deficiency, family history of osteoporosis, self or family history of fragility fracture in first degree relative.  Check insurance on coverage since you have high risk medications that increase risk for osteoporosis.    Heart health: Get at least 150 minutes of aerobic exercise weekly Limit alcohol It is important to maintain a healthy blood pressure and healthy cholesterol numbers  Heart disease screening: Screening for heart disease includes screening for blood pressure, fasting lipids, glucose/diabetes screening, BMI height to weight ratio, reviewed of smoking status, physical activity, and diet.    Goals include blood pressure 120/80 or less, maintaining a healthy lipid/cholesterol profile, preventing diabetes or keeping diabetes numbers under good control, not smoking or using tobacco products, exercising most days per week or at least 150 minutes per week of exercise, and eating healthy variety of fruits and vegetables, healthy oils, and avoiding unhealthy food choices like fried food, fast food, high sugar and high cholesterol foods.  CT cardiac score 09/03/21 IMPRESSION: Coronary calcium score of 40. This was 85th percentile for age-, race-, and sex-matched controls.   Aortic  atherosclerosis.   Echocardiogram 06/25/2021  IMPRESSIONS   1. Left ventricular ejection fraction, by estimation, is 60 to 65%. The  left ventricle has normal function. The left ventricle has no regional  wall motion abnormalities. Left ventricular diastolic parameters were  normal. The average left ventricular  global longitudinal strain is -24.2 %. The global longitudinal strain is  normal.   2. Right ventricular systolic function is normal. The right ventricular  size is normal.   3. The mitral valve is normal in structure. Trivial mitral valve  regurgitation. No evidence of mitral stenosis.   4. The aortic valve is tricuspid. Aortic valve regurgitation is not  visualized. No aortic stenosis is present.   5. The inferior vena cava is normal in size with greater than 50%  respiratory variability, suggesting right atrial pressure of 3 mmHg.   Comparison(s): No prior Echocardiogram.     Medical care options: I recommend you continue to seek care here first for routine care.  We try really hard to have available appointments Monday through Friday daytime hours for sick visits, acute visits, and physicals.  Urgent care should be used for after hours and weekends for significant issues that cannot wait till the next day.  The emergency department should be used for significant potentially life-threatening emergencies.  The emergency department is expensive, can often have long wait times for less significant concerns, so try to utilize primary care, urgent care, or telemedicine when possible to avoid unnecessary trips to the emergency department.  Virtual visits and telemedicine have been introduced since the pandemic started in 2020, and can be convenient ways to receive medical care.  We offer virtual appointments as well to assist you in a variety of options to seek medical care.   Advanced Directives: I recommend you consider completing a [place] and Living Will.    These documents respect your wishes and help alleviate burdens on your loved ones if you were to become terminally ill or be in a position to need those documents enforced.    You can complete Advanced Directives yourself, have them notarized, then have copies made for our office, for you and for anybody you feel should have them in safe keeping.  Or, you can have an attorney prepare these documents.   If you haven't updated your Last Will and Testament in a while, it may be worthwhile having an attorney prepare these documents together and save on some costs.       Separate significant issues discussed: Hypertension - continue current medication  Hyperlipidemia - continue statin  Obesity - continue efforts to lose weight through health diet and exercise  Seizure disorder - managed by neurology, compliant with medication  Rash, tinea cruris - begin topical Lotrisone and oral diflucan as below, referral to dermatology as well

## 2022-07-23 LAB — HEMOGLOBIN A1C

## 2022-07-28 ENCOUNTER — Other Ambulatory Visit (HOSPITAL_COMMUNITY): Payer: Self-pay

## 2022-07-28 ENCOUNTER — Telehealth: Payer: 59 | Admitting: Physician Assistant

## 2022-07-28 DIAGNOSIS — J40 Bronchitis, not specified as acute or chronic: Secondary | ICD-10-CM | POA: Diagnosis not present

## 2022-07-28 DIAGNOSIS — J329 Chronic sinusitis, unspecified: Secondary | ICD-10-CM

## 2022-07-28 MED ORDER — BENZONATATE 100 MG PO CAPS
100.0000 mg | ORAL_CAPSULE | Freq: Three times a day (TID) | ORAL | 0 refills | Status: DC | PRN
  Filled 2022-07-28 (×2): qty 30, 10d supply, fill #0

## 2022-07-28 MED ORDER — PROMETHAZINE-DM 6.25-15 MG/5ML PO SYRP
5.0000 mL | ORAL_SOLUTION | Freq: Four times a day (QID) | ORAL | 0 refills | Status: DC | PRN
  Filled 2022-07-28 (×2): qty 118, 6d supply, fill #0

## 2022-07-28 MED ORDER — AMOXICILLIN-POT CLAVULANATE 875-125 MG PO TABS
1.0000 | ORAL_TABLET | Freq: Two times a day (BID) | ORAL | 0 refills | Status: DC
  Filled 2022-07-28 (×2): qty 20, 10d supply, fill #0

## 2022-07-28 NOTE — Patient Instructions (Signed)
Kenyon Ana., thank you for joining Margaretann Loveless, PA-C for today's virtual visit.  While this provider is not your primary care provider (PCP), if your PCP is located in our provider database this encounter information will be shared with them immediately following your visit.   A [facility] MyChart account gives you access to today's visit and all your visits, tests, and labs performed at [facility] " click here if you don't have a [facility] MyChart account or go to [email or web address]  Consent: (Patient) Cristian Ortiz. provided verbal consent for this virtual visit at the beginning of the encounter.  Current Medications:  Current Outpatient Medications:    amoxicillin-clavulanate (AUGMENTIN) 875-125 MG tablet, Take 1 tablet by mouth 2 (two) times daily., Disp: 20 tablet, Rfl: 0   benzonatate (TESSALON) 100 MG capsule, Take 1 capsule (100 mg total) by mouth 3 (three) times daily as needed., Disp: 30 capsule, Rfl: 0   promethazine-dextromethorphan (PROMETHAZINE-DM) 6.25-15 MG/5ML syrup, Take 5 mLs by mouth 4 (four) times daily as needed., Disp: 118 mL, Rfl: 0   Ascorbic Acid (VITAMIN C) 1000 MG tablet, Take 1,000 mg by mouth daily., Disp: , Rfl:    clotrimazole-betamethasone (LOTRISONE) cream, Apply 1 Application topically daily., Disp: 45 g, Rfl: 0   fluconazole (DIFLUCAN) 150 MG tablet, Take 1 tablet (150 mg total) by mouth once a week., Disp: 3 tablet, Rfl: 0   folic acid (FOLVITE) 1 MG tablet, Take by mouth., Disp: , Rfl:    hydrochlorothiazide (MICROZIDE) 12.5 MG capsule, TAKE 1 CAPSULE BY MOUTH ONCE A DAY, Disp: 90 capsule, Rfl: 0   lisinopril (ZESTRIL) 20 MG tablet, TAKE 1 TABLET BY MOUTH TWO TIMES DAILY, Disp: 180 tablet, Rfl: 3   LORazepam (ATIVAN) 2 MG tablet, Take 2 mg by mouth daily as needed for anxiety or seizure. (Patient not taking: Reported on 07/22/2022), Disp: , Rfl:    Multiple Vitamins-Minerals (MULTIVITAMIN WITH MINERALS) tablet, Take 1  tablet by mouth daily., Disp: , Rfl:    nystatin (MYCOSTATIN/NYSTOP) powder, Apply 1 application topically 2 (two) times daily., Disp: 60 g, Rfl: 0   phenytoin (DILANTIN) 100 MG ER capsule, Take 2 capsules (200 mg total) by mouth 2 (two) times daily., Disp: 360 capsule, Rfl: 4   rosuvastatin (CRESTOR) 40 MG tablet, Take 1 tablet (40 mg total) by mouth daily., Disp: 90 tablet, Rfl: 3   topiramate (TOPAMAX) 100 MG tablet, Take 1 tablet (100 mg total) by mouth 2 (two) times daily, Disp: 180 tablet, Rfl: 4   Vitamin D, Ergocalciferol, (DRISDOL) 1.25 MG (50000 UNIT) CAPS capsule, Take 50,000 Units by mouth every 7 (seven) days., Disp: , Rfl:    Medications ordered in this encounter:  Meds ordered this encounter  Medications   amoxicillin-clavulanate (AUGMENTIN) 875-125 MG tablet    Sig: Take 1 tablet by mouth 2 (two) times daily.    Dispense:  20 tablet    Refill:  0    Order Specific Question:   Supervising Provider    Answer:   Merrilee Jansky [id number]   promethazine-dextromethorphan (PROMETHAZINE-DM) 6.25-15 MG/5ML syrup    Sig: Take 5 mLs by mouth 4 (four) times daily as needed.    Dispense:  118 mL    Refill:  0    Order Specific Question:   Supervising Provider    Answer:   Merrilee Jansky [[id number]]   benzonatate (TESSALON) 100 MG capsule    Sig: Take 1 capsule (100 mg total) by mouth 3 (three)  times daily as needed.    Dispense:  30 capsule    Refill:  0    Order Specific Question:   Supervising Provider    Answer:   Merrilee Jansky [id number]     *If you need refills on other medications prior to your next appointment, please contact your pharmacy*  Follow-Up: Call back or seek an in-person evaluation if the symptoms worsen or if the condition fails to improve as anticipated.  [facility] [phone]  Other Instructions  Acute Bronchitis, Adult  Acute bronchitis is when air tubes in the lungs (bronchi) suddenly get swollen. The condition can make it  hard for you to breathe. In adults, acute bronchitis usually goes away within 2 weeks. A cough caused by bronchitis may last up to 3 weeks. Smoking, allergies, and asthma can make the condition worse. What are the causes? Germs that cause cold and flu (viruses). The most common cause of this condition is the virus that causes the common cold. Bacteria. Substances that bother (irritate) the lungs, including: Smoke from cigarettes and other types of tobacco. Dust and pollen. Fumes from chemicals, gases, or burned fuel. Indoor or outdoor air pollution. What increases the risk? A weak body's defense system. This is also called the immune system. Any condition that affects your lungs and breathing, such as asthma. What are the signs or symptoms? A cough. Coughing up clear, yellow, or green mucus. Making high-pitched whistling sounds when you breathe, most often when you breathe out (wheezing). Runny or stuffy nose. Having too much mucus in your lungs (chest congestion). Shortness of breath. Body aches. A sore throat. How is this treated? Acute bronchitis may go away over time without treatment. Your doctor may tell you to: Drink more fluids. This will help thin your mucus so it is easier to cough up. Use a device that gets medicine into your lungs (inhaler). Use a vaporizer or a humidifier. These are machines that add water to the air. This helps with coughing and poor breathing. Take a medicine that thins mucus and helps clear it from your lungs. Take a medicine that prevents or stops coughing. It is not common to take an antibiotic medicine for this condition. Follow these instructions at home:  Take over-the-counter and prescription medicines only as told by your doctor. Use an inhaler, vaporizer, or humidifier as told by your doctor. Take two teaspoons (10 mL) of honey at bedtime. This helps lessen your coughing at night. Drink enough fluid to keep your pee (urine) pale yellow. Do  not smoke or use any products that contain nicotine or tobacco. If you need help quitting, ask your doctor. Get a lot of rest. Return to your normal activities when your doctor says that it is safe. Keep all follow-up visits. How is this prevented?  Wash your hands often with soap and water for at least 20 seconds. If you cannot use soap and water, use hand sanitizer. Avoid contact with people who have cold symptoms. Try not to touch your mouth, nose, or eyes with your hands. Avoid breathing in smoke or chemical fumes. Make sure to get the flu shot every year. Contact a doctor if: Your symptoms do not get better in 2 weeks. You have trouble coughing up the mucus. Your cough keeps you awake at night. You have a fever. Get help right away if: You cough up blood. You have chest pain. You have very bad shortness of breath. You faint or keep feeling like  you are going to faint. You have a very bad headache. Your fever or chills get worse. These symptoms may be an emergency. Get help right away. Call your local emergency services (911 in the [place] Do not wait to see if the symptoms will go away. Do not drive yourself to the hospital. Summary Acute bronchitis is when air tubes in the lungs (bronchi) suddenly get swollen. In adults, acute bronchitis usually goes away within 2 weeks. Drink more fluids. This will help thin your mucus so it is easier to cough up. Take over-the-counter and prescription medicines only as told by your doctor. Contact a doctor if your symptoms do not improve after 2 weeks of treatment. This information is not intended to replace advice given to you by your health care provider. Make sure you discuss any questions you have with your health care provider. Document Revised: 11/07/2020 Document Reviewed: 11/07/2020 Elsevier Patient Education  2023 [employer]   Sinus Infection, Adult A sinus infection is soreness and swelling (inflammation) of your sinuses.  Sinuses are hollow spaces in the bones around your face. They are located: Around your eyes. In the middle of your forehead. Behind your nose. In your cheekbones. Your sinuses and nasal passages are lined with a fluid called mucus. Mucus drains out of your sinuses. Swelling can trap mucus in your sinuses. This lets germs (bacteria, virus, or fungus) grow, which leads to infection. Most of the time, this condition is caused by a virus. What are the causes? Allergies. Asthma. Germs. Things that block your nose or sinuses. Growths in the nose (nasal polyps). Chemicals or irritants in the air. A fungus. This is rare. What increases the risk? Having a weak body defense system (immune system). Doing a lot of swimming or diving. Using nasal sprays too much. Smoking. What are the signs or symptoms? The main symptoms of this condition are pain and a feeling of pressure around the sinuses. Other symptoms include: Stuffy nose (congestion). This may make it hard to breathe through your nose. Runny nose (drainage). Soreness, swelling, and warmth in the sinuses. A cough that may get worse at night. Being unable to smell and taste. Mucus that collects in the throat or the back of the nose (postnasal drip). This may cause a sore throat or bad breath. Being very tired (fatigued). A fever. How is this diagnosed? Your symptoms. Your medical history. A physical exam. Tests to find out if your condition is short-term (acute) or long-term (chronic). Your doctor may: Check your nose for growths (polyps). Check your sinuses using a tool that has a light on one end (endoscope). Check for allergies or germs. Do imaging tests, such as an MRI or CT scan. How is this treated? Treatment for this condition depends on the cause and whether it is short-term or long-term. If caused by a virus, your symptoms should go away on their own within 10 days. You may be given medicines to relieve symptoms. They  include: Medicines that shrink swollen tissue in the nose. A spray that treats swelling of the nostrils. Rinses that help get rid of thick mucus in your nose (nasal saline washes). Medicines that treat allergies (antihistamines). Over-the-counter pain relievers. If caused by bacteria, your doctor may wait to see if you will get better without treatment. You may be given antibiotic medicine if you have: A very bad infection. A weak body defense system. If caused by growths in the nose, surgery may be needed. Follow these instructions at home: Medicines  Take, use, or apply over-the-counter and prescription medicines only as told by your doctor. These may include nasal sprays. If you were prescribed an antibiotic medicine, take it as told by your doctor. Do not stop taking it even if you start to feel better. Hydrate and humidify  Drink enough water to keep your pee (urine) pale yellow. Use a cool mist humidifier to keep the humidity level in your home above 50%. Breathe in steam for 10-15 minutes, 3-4 times a day, or as told by your doctor. You can do this in the bathroom while a hot shower is running. Try not to spend time in cool or dry air. Rest Rest as much as you can. Sleep with your head raised (elevated). Make sure you get enough sleep each night. General instructions  Put a warm, moist washcloth on your face 3-4 times a day, or as often as told by your doctor. Use nasal saline washes as often as told by your doctor. Wash your hands often with soap and water. If you cannot use soap and water, use hand sanitizer. Do not smoke. Avoid being around people who are smoking (secondhand smoke). Keep all follow-up visits. Contact a doctor if: You have a fever. Your symptoms get worse. Your symptoms do not get better within 10 days. Get help right away if: You have a very bad headache. You cannot stop vomiting. You have very bad pain or swelling around your face or eyes. You have  trouble seeing. You feel confused. Your neck is stiff. You have trouble breathing. These symptoms may be an emergency. Get help right away. Call 911. Do not wait to see if the symptoms will go away. Do not drive yourself to the hospital. Summary A sinus infection is swelling of your sinuses. Sinuses are hollow spaces in the bones around your face. This condition is caused by tissues in your nose that become inflamed or swollen. This traps germs. These can lead to infection. If you were prescribed an antibiotic medicine, take it as told by your doctor. Do not stop taking it even if you start to feel better. Keep all follow-up visits. This information is not intended to replace advice given to you by your health care provider. Make sure you discuss any questions you have with your health care provider. Document Revised: 06/11/2021 Document Reviewed: 06/11/2021 Elsevier Patient Education  [place].    If you have been instructed to have an in-person evaluation today at a local Urgent Care facility, please use the link below. It will take you to a list of all of our available [facility], including address, phone number and hours of operation. Please do not delay care.  [facility]  If you or a family member do not have a primary care provider, use the link below to schedule a visit and establish care. When you choose a [facility] primary care physician or advanced practice provider, you gain a long-term partner in health. Find a Primary Care Provider  Learn more about 's in-office and virtual care options: [facility]

## 2022-07-28 NOTE — Progress Notes (Signed)
Virtual Visit Consent   Cristian Ortiz., you are scheduled for a virtual visit with a [facility] provider today. Just as with appointments in the office, your consent must be obtained to participate. Your consent will be active for this visit and any virtual visit you may have with one of our providers in the next 365 days. If you have a MyChart account, a copy of this consent can be sent to you electronically.  As this is a virtual visit, video technology does not allow for your provider to perform a traditional examination. This may limit your provider's ability to fully assess your condition. If your provider identifies any concerns that need to be evaluated in person or the need to arrange testing (such as labs, EKG, etc.), we will make arrangements to do so. Although advances in technology are sophisticated, we cannot ensure that it will always work on either your end or our end. If the connection with a video visit is poor, the visit may have to be switched to a telephone visit. With either a video or telephone visit, we are not always able to ensure that we have a secure connection.  By engaging in this virtual visit, you consent to the provision of healthcare and authorize for your insurance to be billed (if applicable) for the services provided during this visit. Depending on your insurance coverage, you may receive a charge related to this service.  I need to obtain your verbal consent now. Are you willing to proceed with your visit today? Cristian Ortiz. has provided verbal consent on 07/28/2022 for a virtual visit (video or telephone). Margaretann Loveless, PA-C  Date: 07/28/2022 12:05 PM  Virtual Visit via Video Note   I, Margaretann Loveless, connected with  Tylique Aull.  ([id number], Jul 15, 1966) on 07/28/22 at 12:00 PM EST by a video-enabled telemedicine application and verified that I am speaking with the correct person using two identifiers.  Location: Patient: Virtual Visit  Location Patient: Home Provider: Virtual Visit Location Provider: Home Office   I discussed the limitations of evaluation and management by telemedicine and the availability of in person appointments. The patient expressed understanding and agreed to proceed.    History of Present Illness: Cristian Ortiz. is a 57 y.o. who identifies as a male who was assigned male at birth, and is being seen today for possible sinus infection with cough.  HPI: Sinusitis This is a new problem. The current episode started in the past 7 days. The problem has been gradually worsening since onset. Maximum temperature: subjective fevers. The fever has been present for Less than 1 day. Associated symptoms include congestion, coughing, headaches, sinus pressure and a sore throat (from coughing). Pertinent negatives include no chills, diaphoresis, ear pain, hoarse voice, shortness of breath or sneezing. (Rhinorrhea, post nasal drainage) Treatments tried: dayquil, nyquil. The treatment provided no relief.  Negative at home covid test    Problems:  Patient Active Problem List   Diagnosis Date Noted   Rash 07/22/2022   Aortic atherosclerosis (HCC) 07/22/2022   Trigger middle finger of right hand 01/29/2022   Hand weakness 01/29/2022   High risk medication use 01/29/2022   Impaired fasting blood sugar 10/16/2021   Coronary artery calcification seen on CT scan 10/16/2021   Encounter for health maintenance examination in adult 07/17/2021   Vaccine counseling 07/17/2021   History of sleep apnea 07/17/2021   Screening for prostate cancer 05/21/2021   Screen for colon cancer 05/21/2021   Abnormal  pigmentation 11/01/2019   Gynecomastia 11/01/2019   Hypertension 11/01/2019   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   Hyperlipidemia 01/31/2008   Fam hx-ischem heart disease 05/19/2006   Allergic rhinitis 07/11/2005   Seizure disorder ([place]) 07/11/2005    Allergies: No Known Allergies Medications:  Current Outpatient  Medications:    amoxicillin-clavulanate (AUGMENTIN) 875-125 MG tablet, Take 1 tablet by mouth 2 (two) times daily., Disp: 20 tablet, Rfl: 0   Ascorbic Acid (VITAMIN C) 1000 MG tablet, Take 1,000 mg by mouth daily., Disp: , Rfl:    benzonatate (TESSALON) 100 MG capsule, Take 1 capsule (100 mg total) by mouth 3 (three) times daily as needed., Disp: 30 capsule, Rfl: 0   clotrimazole-betamethasone (LOTRISONE) cream, Apply 1 Application topically daily., Disp: 45 g, Rfl: 0   fluconazole (DIFLUCAN) 150 MG tablet, Take 1 tablet (150 mg total) by mouth once a week., Disp: 3 tablet, Rfl: 0   folic acid (FOLVITE) 1 MG tablet, Take by mouth., Disp: , Rfl:    hydrochlorothiazide (MICROZIDE) 12.5 MG capsule, TAKE 1 CAPSULE BY MOUTH ONCE A DAY, Disp: 90 capsule, Rfl: 0   lisinopril (ZESTRIL) 20 MG tablet, TAKE 1 TABLET BY MOUTH TWO TIMES DAILY, Disp: 180 tablet, Rfl: 3   LORazepam (ATIVAN) 2 MG tablet, Take 2 mg by mouth daily as needed for anxiety or seizure. (Patient not taking: Reported on 07/22/2022), Disp: , Rfl:    Multiple Vitamins-Minerals (MULTIVITAMIN WITH MINERALS) tablet, Take 1 tablet by mouth daily., Disp: , Rfl:    nystatin (MYCOSTATIN/NYSTOP) powder, Apply 1 application topically 2 (two) times daily., Disp: 60 g, Rfl: 0   phenytoin (DILANTIN) 100 MG ER capsule, Take 2 capsules (200 mg total) by mouth 2 (two) times daily., Disp: 360 capsule, Rfl: 4   promethazine-dextromethorphan (PROMETHAZINE-DM) 6.25-15 MG/5ML syrup, Take 5 mLs by mouth 4 (four) times daily as needed., Disp: 118 mL, Rfl: 0   rosuvastatin (CRESTOR) 40 MG tablet, Take 1 tablet (40 mg total) by mouth daily., Disp: 90 tablet, Rfl: 3   topiramate (TOPAMAX) 100 MG tablet, Take 1 tablet (100 mg total) by mouth 2 (two) times daily, Disp: 180 tablet, Rfl: 4   Vitamin D, Ergocalciferol, (DRISDOL) 1.25 MG (50000 UNIT) CAPS capsule, Take 50,000 Units by mouth every 7 (seven) days., Disp: , Rfl:   Observations/Objective: Patient is  well-developed, well-nourished in no acute distress.  Resting comfortably at home.  Head is normocephalic, atraumatic.  No labored breathing.  Speech is clear and coherent with logical content.  Patient is alert and oriented at baseline.    Assessment and Plan: 1. Sinobronchitis - amoxicillin-clavulanate (AUGMENTIN) 875-125 MG tablet; Take 1 tablet by mouth 2 (two) times daily.  Dispense: 20 tablet; Refill: 0 - promethazine-dextromethorphan (PROMETHAZINE-DM) 6.25-15 MG/5ML syrup; Take 5 mLs by mouth 4 (four) times daily as needed.  Dispense: 118 mL; Refill: 0 - benzonatate (TESSALON) 100 MG capsule; Take 1 capsule (100 mg total) by mouth 3 (three) times daily as needed.  Dispense: 30 capsule; Refill: 0  - Worsening over a week despite OTC medications - Will treat with Augmentin, Promethazine DM and tessalon perles - Can continue Mucinex  - Push fluids.  - Rest.  - Steam and humidifier can help - Seek in person evaluation if worsening or symptoms fail to improve    Follow Up Instructions: I discussed the assessment and treatment plan with the patient. The patient was provided an opportunity to ask questions and all were answered. The patient agreed with the plan and demonstrated  an understanding of the instructions.  A copy of instructions were sent to the patient via MyChart unless otherwise noted below.    The patient was advised to call back or seek an in-person evaluation if the symptoms worsen or if the condition fails to improve as anticipated.  Time:  I spent 8 minutes with the patient via telehealth technology discussing the above problems/concerns.    Mar Daring, PA-C

## 2022-07-29 LAB — COMPREHENSIVE METABOLIC PANEL
ALT: 22 IU/L (ref 0–44)
AST: 25 IU/L (ref 0–40)
Albumin/Globulin Ratio: 1.5 (ref 1.2–2.2)
Albumin: 4.2 g/dL (ref 3.8–4.9)
Alkaline Phosphatase: 96 IU/L (ref 44–121)
BUN/Creatinine Ratio: 18 (ref 9–20)
BUN: 18 mg/dL (ref 6–24)
Bilirubin Total: <0.2 mg/dL (ref 0.0–1.2)
CO2: 20 mmol/L (ref 20–29)
Calcium: 9.2 mg/dL (ref 8.7–10.2)
Chloride: 104 mmol/L (ref 96–106)
Creatinine, Ser: 1.01 mg/dL (ref 0.76–1.27)
Globulin, Total: 2.8 g/dL (ref 1.5–4.5)
Glucose: 105 mg/dL — ABNORMAL HIGH (ref 70–99)
Potassium: 4.5 mmol/L (ref 3.5–5.2)
Sodium: 139 mmol/L (ref 134–144)
Total Protein: 7 g/dL (ref 6.0–8.5)
eGFR: 88 mL/min/{1.73_m2} (ref >59)

## 2022-07-29 LAB — PSA: Prostate Specific Ag, Serum: 0.6 ng/mL (ref 0.0–4.0)

## 2022-07-29 LAB — HEMOGLOBIN A1C

## 2022-07-30 NOTE — Progress Notes (Signed)
Please check with Cristian Ortiz in the lab and see why the hemoglobin A1c was canceled.  I really need this test.  (Need to make sure we are drawing enough blood and the correct amount of tubes for the labs)  Blood sugar slightly elevated putting him at risk for diabetes.  Liver kidney electrolytes normal, prostate blood test normal

## 2022-08-02 ENCOUNTER — Other Ambulatory Visit: Payer: Self-pay

## 2022-08-02 ENCOUNTER — Other Ambulatory Visit (HOSPITAL_COMMUNITY): Payer: Self-pay

## 2022-08-02 ENCOUNTER — Other Ambulatory Visit: Payer: Self-pay | Admitting: Medical

## 2022-08-02 DIAGNOSIS — I10 Essential (primary) hypertension: Secondary | ICD-10-CM

## 2022-08-04 ENCOUNTER — Other Ambulatory Visit: Payer: Self-pay

## 2022-08-04 ENCOUNTER — Other Ambulatory Visit (HOSPITAL_COMMUNITY): Payer: Self-pay

## 2022-08-04 MED ORDER — LISINOPRIL 20 MG PO TABS
ORAL_TABLET | Freq: Two times a day (BID) | ORAL | 1 refills | Status: DC
  Filled 2022-08-04: qty 180, 90d supply, fill #0
  Filled 2022-10-28: qty 180, 90d supply, fill #1

## 2022-08-05 ENCOUNTER — Other Ambulatory Visit: Payer: Self-pay

## 2022-08-05 ENCOUNTER — Other Ambulatory Visit (HOSPITAL_COMMUNITY): Payer: Self-pay

## 2022-08-12 ENCOUNTER — Other Ambulatory Visit: Payer: Self-pay | Admitting: Internal Medicine

## 2022-08-13 MED ORDER — ROSUVASTATIN CALCIUM 40 MG PO TABS
40.0000 mg | ORAL_TABLET | Freq: Every day | ORAL | 0 refills | Status: DC
  Filled 2022-08-13 – 2022-08-29 (×2): qty 30, 30d supply, fill #0

## 2022-08-14 ENCOUNTER — Other Ambulatory Visit (HOSPITAL_COMMUNITY): Payer: Self-pay

## 2022-08-15 ENCOUNTER — Other Ambulatory Visit (HOSPITAL_COMMUNITY): Payer: Self-pay

## 2022-08-29 ENCOUNTER — Other Ambulatory Visit: Payer: Self-pay | Admitting: Medical

## 2022-08-29 DIAGNOSIS — I10 Essential (primary) hypertension: Secondary | ICD-10-CM

## 2022-08-30 ENCOUNTER — Other Ambulatory Visit (HOSPITAL_COMMUNITY): Payer: Self-pay

## 2022-09-01 ENCOUNTER — Other Ambulatory Visit: Payer: Self-pay

## 2022-09-01 ENCOUNTER — Other Ambulatory Visit (HOSPITAL_COMMUNITY): Payer: Self-pay

## 2022-09-01 MED ORDER — HYDROCHLOROTHIAZIDE 12.5 MG PO CAPS
12.5000 mg | ORAL_CAPSULE | Freq: Every day | ORAL | 0 refills | Status: DC
  Filled 2022-09-01: qty 90, 90d supply, fill #0

## 2022-10-21 ENCOUNTER — Other Ambulatory Visit (INDEPENDENT_AMBULATORY_CARE_PROVIDER_SITE_OTHER): Payer: 59

## 2022-10-21 DIAGNOSIS — Z23 Encounter for immunization: Secondary | ICD-10-CM | POA: Diagnosis not present

## 2022-10-22 ENCOUNTER — Other Ambulatory Visit: Payer: Self-pay | Admitting: Internal Medicine

## 2022-10-23 ENCOUNTER — Other Ambulatory Visit: Payer: Self-pay

## 2022-10-23 MED ORDER — ROSUVASTATIN CALCIUM 40 MG PO TABS
40.0000 mg | ORAL_TABLET | Freq: Every day | ORAL | 0 refills | Status: DC
  Filled 2022-10-23: qty 15, 15d supply, fill #0

## 2022-10-28 ENCOUNTER — Other Ambulatory Visit: Payer: Self-pay

## 2022-10-28 ENCOUNTER — Other Ambulatory Visit: Payer: Self-pay | Admitting: Internal Medicine

## 2022-10-28 ENCOUNTER — Other Ambulatory Visit (HOSPITAL_COMMUNITY): Payer: Self-pay

## 2022-11-10 ENCOUNTER — Other Ambulatory Visit (HOSPITAL_COMMUNITY): Payer: Self-pay

## 2022-12-06 ENCOUNTER — Other Ambulatory Visit: Payer: Self-pay | Admitting: Medical

## 2022-12-06 DIAGNOSIS — I10 Essential (primary) hypertension: Secondary | ICD-10-CM

## 2022-12-08 ENCOUNTER — Other Ambulatory Visit (HOSPITAL_COMMUNITY): Payer: Self-pay

## 2022-12-08 MED ORDER — HYDROCHLOROTHIAZIDE 12.5 MG PO CAPS
12.5000 mg | ORAL_CAPSULE | Freq: Every day | ORAL | 2 refills | Status: DC
  Filled 2022-12-08: qty 90, 90d supply, fill #0
  Filled 2023-02-06 – 2023-03-03 (×2): qty 90, 90d supply, fill #1
  Filled 2023-05-27: qty 90, 90d supply, fill #2

## 2023-02-06 ENCOUNTER — Other Ambulatory Visit: Payer: Self-pay | Admitting: Medical

## 2023-02-06 DIAGNOSIS — I10 Essential (primary) hypertension: Secondary | ICD-10-CM

## 2023-02-07 ENCOUNTER — Other Ambulatory Visit (HOSPITAL_COMMUNITY): Payer: Self-pay

## 2023-02-09 ENCOUNTER — Other Ambulatory Visit (HOSPITAL_COMMUNITY): Payer: Self-pay

## 2023-02-09 ENCOUNTER — Other Ambulatory Visit: Payer: Self-pay

## 2023-02-09 MED ORDER — LISINOPRIL 20 MG PO TABS
ORAL_TABLET | Freq: Two times a day (BID) | ORAL | 0 refills | Status: DC
  Filled 2023-02-09: qty 180, 90d supply, fill #0

## 2023-03-03 ENCOUNTER — Other Ambulatory Visit: Payer: Self-pay | Admitting: Internal Medicine

## 2023-03-03 ENCOUNTER — Other Ambulatory Visit: Payer: Self-pay

## 2023-03-03 ENCOUNTER — Other Ambulatory Visit (HOSPITAL_COMMUNITY): Payer: Self-pay

## 2023-05-08 ENCOUNTER — Other Ambulatory Visit: Payer: Self-pay

## 2023-05-08 ENCOUNTER — Other Ambulatory Visit: Payer: Self-pay | Admitting: Medical

## 2023-05-08 DIAGNOSIS — I10 Essential (primary) hypertension: Secondary | ICD-10-CM

## 2023-05-08 MED ORDER — LISINOPRIL 20 MG PO TABS
20.0000 mg | ORAL_TABLET | Freq: Two times a day (BID) | ORAL | 0 refills | Status: DC
  Filled 2023-05-08: qty 180, 90d supply, fill #0

## 2023-05-11 ENCOUNTER — Other Ambulatory Visit: Payer: Self-pay

## 2023-05-12 ENCOUNTER — Other Ambulatory Visit: Payer: Self-pay

## 2023-05-12 ENCOUNTER — Encounter: Payer: Self-pay | Admitting: Neurology

## 2023-05-12 ENCOUNTER — Other Ambulatory Visit (HOSPITAL_COMMUNITY): Payer: Self-pay

## 2023-05-12 ENCOUNTER — Ambulatory Visit: Payer: 59 | Admitting: Neurology

## 2023-05-12 VITALS — BP 123/80 | HR 79 | Ht 72.0 in | Wt 291.2 lb

## 2023-05-12 DIAGNOSIS — G40909 Epilepsy, unspecified, not intractable, without status epilepticus: Secondary | ICD-10-CM

## 2023-05-12 MED ORDER — TOPIRAMATE 100 MG PO TABS
100.0000 mg | ORAL_TABLET | Freq: Two times a day (BID) | ORAL | 4 refills | Status: DC
  Filled 2023-05-12 – 2023-08-03 (×2): qty 180, 90d supply, fill #0
  Filled 2023-11-01: qty 180, 90d supply, fill #1
  Filled 2024-01-28: qty 180, 90d supply, fill #2
  Filled 2024-04-27: qty 180, 90d supply, fill #3

## 2023-05-12 MED ORDER — PHENYTOIN SODIUM EXTENDED 100 MG PO CAPS
200.0000 mg | ORAL_CAPSULE | Freq: Two times a day (BID) | ORAL | 4 refills | Status: DC
  Filled 2023-05-12 – 2023-05-13 (×2): qty 360, 90d supply, fill #0
  Filled 2023-08-09: qty 360, 90d supply, fill #1
  Filled 2023-11-05: qty 360, 90d supply, fill #2
  Filled 2024-02-02: qty 360, 90d supply, fill #3
  Filled 2024-04-30: qty 360, 90d supply, fill #4

## 2023-05-12 NOTE — Patient Instructions (Signed)
Good to see you doing well. Continue all your medications. Follow-up in 1 year, call for any changes. ° ° °Seizure Precautions: °1. If medication has been prescribed for you to prevent seizures, take it exactly as directed.  Do not stop taking the medicine without talking to your doctor first, even if you have not had a seizure in a long time.  ° °2. Avoid activities in which a seizure would cause danger to yourself or to others.  Don't operate dangerous machinery, swim alone, or climb in high or dangerous places, such as on ladders, roofs, or girders.  Do not drive unless your doctor says you may. ° °3. If you have any warning that you may have a seizure, lay down in a safe place where you can't hurt yourself.   ° °4.  No driving for 6 months from last seizure, as per [place] state law.   Please refer to the following link on the Epilepsy Foundation of America's website for more information: http://www.epilepsyfoundation.org/answerplace/Social/driving/drivingu.cfm  ° °5.  Maintain good sleep hygiene. Avoid alcohol. ° °6.  Contact your doctor if you have any problems that may be related to the medicine you are taking. ° °7.  Call 911 and bring the patient back to the ED if: °      ° A.  The seizure lasts longer than 5 minutes.      ° B.  The patient doesn't awaken shortly after the seizure ° C.  The patient has new problems such as difficulty seeing, speaking or moving ° D.  The patient was injured during the seizure ° E.  The patient has a temperature over 102 F (39C) ° F.  The patient vomited and now is having trouble breathing °      ° °

## 2023-05-12 NOTE — Progress Notes (Signed)
NEUROLOGY FOLLOW UP OFFICE NOTE  Cristian Ortiz [id number] [birth year]  HISTORY OF PRESENT ILLNESS: I had the pleasure of seeing Cristian Ortiz. in follow-up in the neurology clinic on 05/12/2023.  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 He is alone in the office today. Records and images were personally reviewed where available. Since his last visit, he continues to do well seizure-free since 2019 (due to missed medication), on Phenytoin 200mg  BID (100mg  2 caps BID) and Topiramate 100mg  BID without side effects. He is on a daily vitamin D supplement. He denies any staring/unresponsive episodes, gaps in time, olfactory/gustatory hallucinations, focal numbness/tingling/weakness, myoclonic jerks. No headaches, dizziness, vision changes, no falls. He got a promotion and works during the day, sleep is better, he gets 8-10 hours of sleep. He also has a [employer] which is doing well. Mood is good. He lives with his wife.    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 Records from his neurologist Dr. Sherryll Burger were reviewed. He was last seen by Dr. Sherryll Burger in 02/2021. Per notes, first seizure occurred in 2000. He was started on Dilantin and went seizure-free for a year until he had a seizure and dislocated his right shoulder. There is note of a history of status epilepticus in 2004. He reports his seizures are exclusively nocturnal. Notes indicate that his wife has reported he would be staring, unresponsive with lip smacking/chewing movements, trying to get up. He would be confused for 1-2 hours after, agitated. He has had bowel incontinence and tongue bite with the seizures, no focal weakness. There is a note of generalized convulsions as well. They usually occur 30-60 minutes after sleep.  Per notes, MRI and EEG in 2004 were done, results unavailable for review. His last seizure was in 09/2017 in the setting of missed medication. He has been on Topiramate 100mg  BID and Phenytoin 200mg  BID for many years without side effects. His wife has Ativan by her bedside to administer, but has not needed it as he has been doing well. He denies any olfactory/gustatory hallucinations, deja vu, rising epigastric sensation, focal numbness/tingling/weakness, myoclonic jerks. He denies any headaches, dizziness, vision changes, neck/back pain, bowel/bladder dysfunction.He has recently switched his work schedule and this is helping with his sleep, mood, and memory. He is compliant with medications and has a pillbox. No falls.   Epilepsy Risk Factors:  He had a normal birth and early development.  There is no history of febrile convulsions, CNS infections such as meningitis/encephalitis, significant traumatic brain injury, neurosurgical procedures, or family history of seizures.  Laboratory Data:  02/2021:  Dilantin level 8 Vitamin D normal 37.7  Diagnostic Data: No prior EEG or MRI available for review  PAST MEDICAL HISTORY: Past Medical History:  Diagnosis Date   Hyperlipidemia    Hypertension    Seizures (HCC)     MEDICATIONS: Current Outpatient Medications on File Prior to Visit  Medication Sig Dispense Refill   Ascorbic Acid (VITAMIN C) 1000 MG tablet Take 1,000 mg by mouth daily.     benzonatate (TESSALON) 100 MG capsule Take 1 capsule (100 mg total) by mouth 3 (three) times daily as needed. 30 capsule 0   clotrimazole-betamethasone (LOTRISONE) cream Apply 1 Application topically daily. 45 g 0   fluconazole (DIFLUCAN) 150 MG tablet Take 1 tablet (150 mg total) by  mouth once a week. 3 tablet 0   folic acid (FOLVITE) 1 MG tablet Take by mouth.     hydrochlorothiazide (MICROZIDE) 12.5 MG capsule Take 1 capsule (12.5 mg total) by mouth daily. 90 capsule 2   lisinopril (ZESTRIL) 20 MG tablet  Take 1 tablet (20 mg total) by mouth 2 (two) times daily. 180 tablet 0   LORazepam (ATIVAN) 2 MG tablet Take 2 mg by mouth daily as needed for anxiety or seizure.     Multiple Vitamins-Minerals (MULTIVITAMIN WITH MINERALS) tablet Take 1 tablet by mouth daily.     nystatin (MYCOSTATIN/NYSTOP) powder Apply 1 application topically 2 (two) times daily. 60 g 0   phenytoin (DILANTIN) 100 MG ER capsule Take 2 capsules (200 mg total) by mouth 2 (two) times daily. 360 capsule 4   rosuvastatin (CRESTOR) 40 MG tablet Take 1 tablet (40 mg total) by mouth daily. Will need an appointment for further refills. 15 tablet 0   topiramate (TOPAMAX) 100 MG tablet Take 1 tablet (100 mg total) by mouth 2 (two) times daily 180 tablet 4   Vitamin D, Ergocalciferol, (DRISDOL) 1.25 MG (50000 UNIT) CAPS capsule Take 50,000 Units by mouth every 7 (seven) days.     No current facility-administered medications on file prior to visit.    ALLERGIES: No Known Allergies  FAMILY HISTORY: Family History  Problem Relation Age of Onset   Hypertension Mother    Heart disease Father    Congestive Heart Failure Father    Cancer Father        pancreatic   Diabetes Father    Cataracts Father    Hypertension Sister    Hypertension Brother    Hypertension Brother    Congestive Heart Failure Brother     SOCIAL HISTORY: Social History   Socioeconomic History   Marital status: Married    Spouse name: Not on file   Number of children: Not on file   Years of education: Not on file   Highest education level: Not on file  Occupational History   Not on file  Tobacco Use   Smoking status: Never   Smokeless tobacco: Never  Vaping Use   Vaping status: Never Used  Substance and Sexual Activity   Alcohol use: No   Drug use: No   Sexual activity: Yes    Partners: Female  Other Topics Concern   Not on file  Social History Narrative   Right handed    Works as Education officer, community with Physicist, medical   Married   No children.      Exercise - walking   07/2022   Social Determinants of Health   Financial Resource Strain: Not on file  Food Insecurity: Not on file  Transportation Needs: Not on file  Physical Activity: Not on file  Stress: Not on file  Social Connections: Not on file  Intimate Partner Violence: Not on file     PHYSICAL EXAM: Vitals:   05/12/23 0816  BP: 123/80  Pulse: 79  SpO2: 100%   General: No acute distress Head:  Normocephalic/atraumatic Skin/Extremities: No rash, no edema Neurological Exam: alert and awake. No aphasia or dysarthria. Fund of knowledge is appropriate.  Attention and concentration are normal.   Cranial nerves: Pupils equal, round. Extraocular movements intact with no nystagmus. Visual fields full.  No facial asymmetry.  Motor: Bulk and tone normal, muscle strength 5/5 throughout with no pronator drift.   Finger to nose testing intact.  Gait narrow-based and steady, able to  tandem walk adequately.  Romberg negative.   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 He has been seizure-free since 09/2017 (due to missed medication), continue Topiramate 100mg  BID and Phenytoin 200mg  BID, refills sent. Continue vitamin D supplements. Last bone density scan 01/2021 normal, recommend bone density scan every 3 years (due 2025). He is aware of [place] driving laws to stop driving after a seizure until 6 months seizure-free. Follow-up in 1 year, call for any changes.    Thank you for allowing me to participate in his care.  Please do not hesitate to call for any questions or concerns.    Patrcia Dolly, M.D.   CC: Crosby Oyster, PA-C

## 2023-05-13 ENCOUNTER — Other Ambulatory Visit (HOSPITAL_COMMUNITY): Payer: Self-pay

## 2023-05-13 ENCOUNTER — Other Ambulatory Visit: Payer: Self-pay

## 2023-05-28 ENCOUNTER — Other Ambulatory Visit: Payer: Self-pay

## 2023-06-09 ENCOUNTER — Other Ambulatory Visit (HOSPITAL_COMMUNITY): Payer: Self-pay

## 2023-06-10 ENCOUNTER — Other Ambulatory Visit: Payer: Self-pay

## 2023-06-10 ENCOUNTER — Telehealth: Payer: 59 | Admitting: Family Medicine

## 2023-06-10 ENCOUNTER — Other Ambulatory Visit (HOSPITAL_COMMUNITY): Payer: Self-pay

## 2023-06-10 DIAGNOSIS — B9689 Other specified bacterial agents as the cause of diseases classified elsewhere: Secondary | ICD-10-CM | POA: Diagnosis not present

## 2023-06-10 DIAGNOSIS — J019 Acute sinusitis, unspecified: Secondary | ICD-10-CM

## 2023-06-10 MED ORDER — AMOXICILLIN-POT CLAVULANATE 875-125 MG PO TABS
1.0000 | ORAL_TABLET | Freq: Two times a day (BID) | ORAL | 0 refills | Status: AC
  Filled 2023-06-10 (×2): qty 14, 7d supply, fill #0

## 2023-06-10 NOTE — Progress Notes (Signed)

## 2023-07-22 HISTORY — PX: EYE SURGERY: SHX253

## 2023-07-24 IMAGING — CT CT CARDIAC CORONARY ARTERY CALCIUM SCORE
3 series · 14 of 20 positions shown, 16 images · non-contrast
Comparison: None.
COMPARISON: None.

Addendum:
EXAM:
OVER-READ INTERPRETATION  CT CHEST

The following report is an over-read performed by radiologist Dr.
Jalver Saa [REDACTED] on 09/03/2021. This
over-read does not include interpretation of cardiac or coronary
anatomy or pathology. The coronary calcium score interpretation by
the cardiologist is attached.
CLINICAL DATA: cardiovascular disease risk stratification
Coronary Calcium Score
TECHNIQUE: A gated, non-contrast computed tomography scan of the heart was
performed using 3mm slice thickness. Axial images were analyzed on a
dedicated workstation. Calcium scoring of the coronary arteries was
performed using the Agatston method.

[Series 2: cascseq 2.0 sa36 70% (id) · axial · 0.46mm/px · z∈[-247,-157]mm · 4 of 76 slices shown]
[im 16/76  vessel]
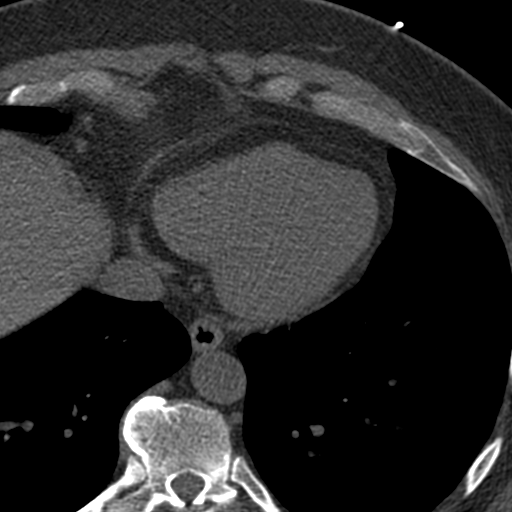
[im 31/76  vessel]
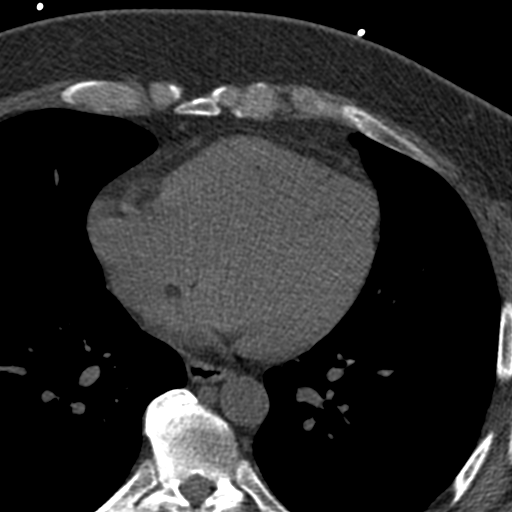
[im 46/76  vessel]
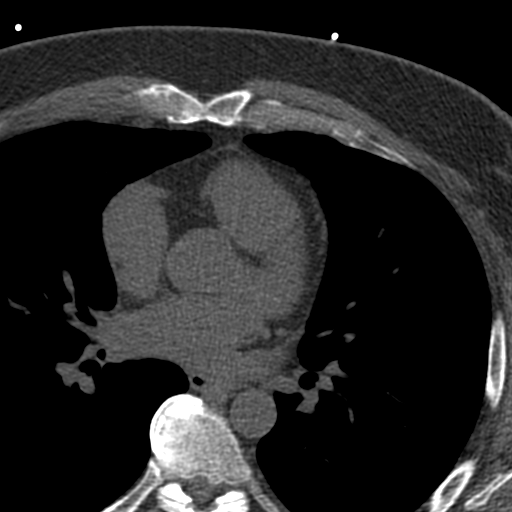
[im 61/76  vessel]
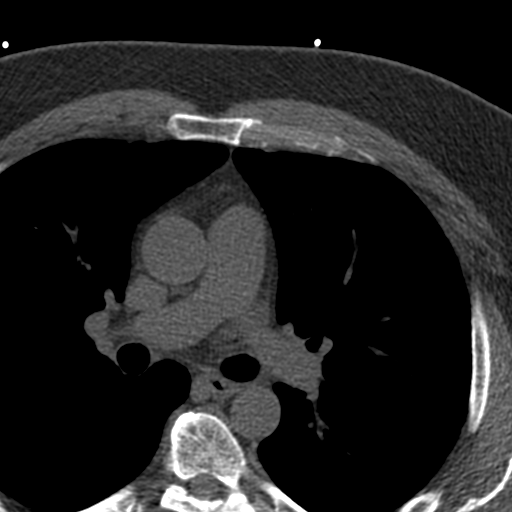

[Series 3: cascseq 2.0 bf37 st · axial · 0.76mm/px · z∈[-253,-153]mm · 5 of 76 slices shown, 7 images]
[im 13/76  vessel]
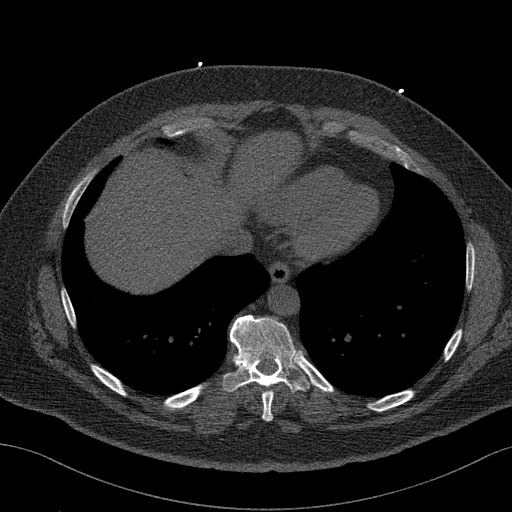
[im 13/76  lung]
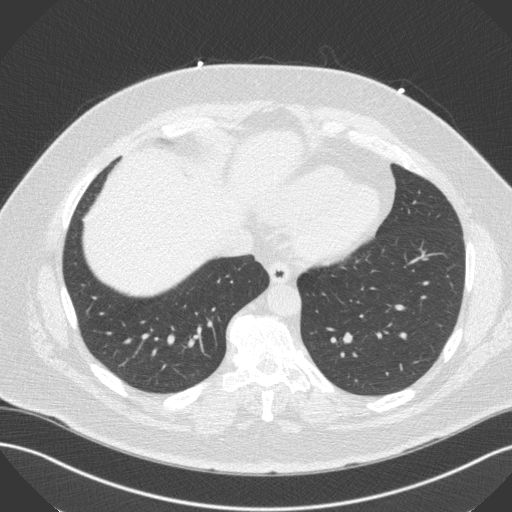
[im 26/76  vessel]
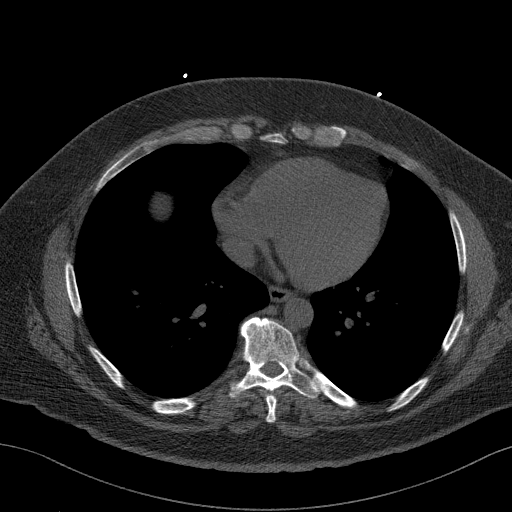
[im 38/76  vessel]
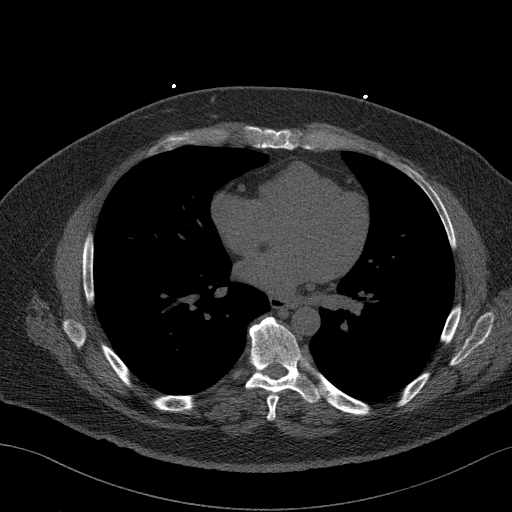
[im 51/76  vessel]
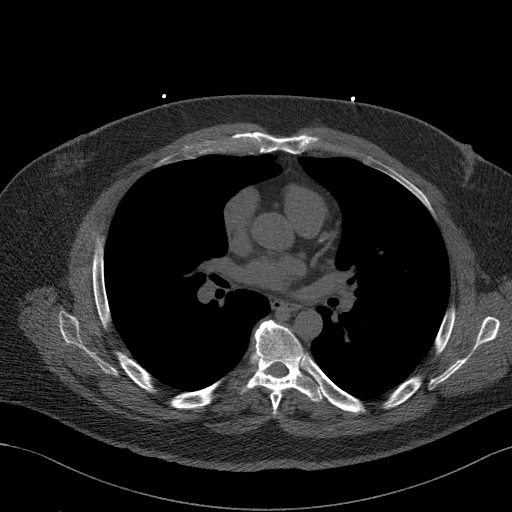
[im 63/76  vessel]
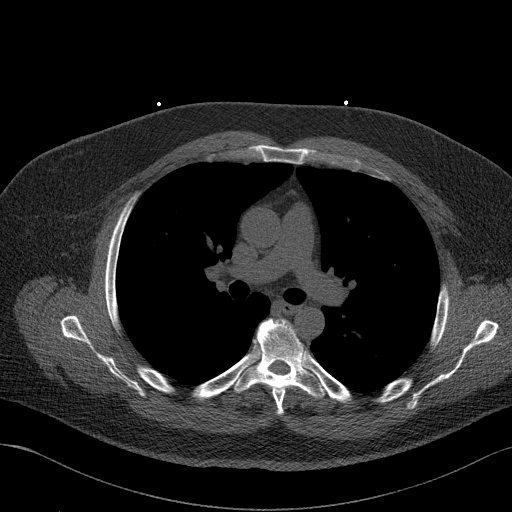
[im 63/76  lung]
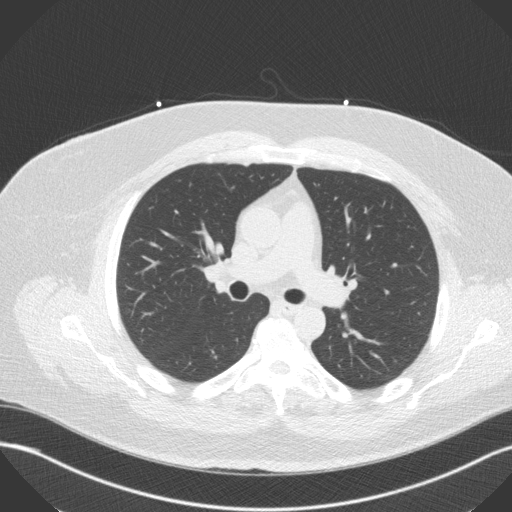

[Series 4: cascseq 2.0 br59 lung · axial · 0.76mm/px · z∈[-253,-153]mm · 5 of 76 slices shown]
[im 13/76  lung]
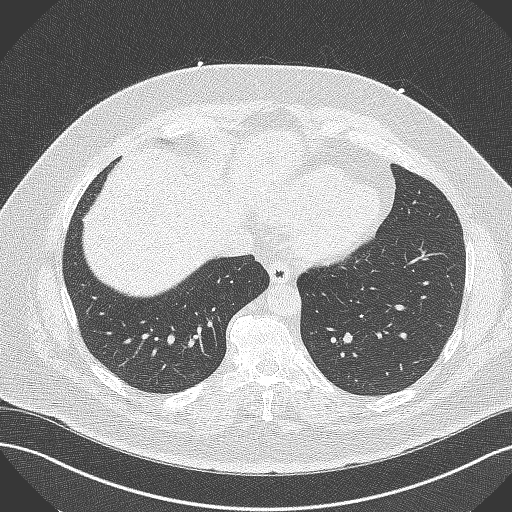
[im 26/76  lung]
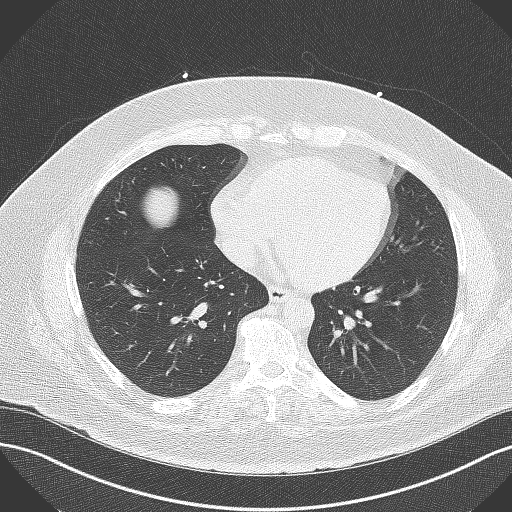
[im 38/76  lung]
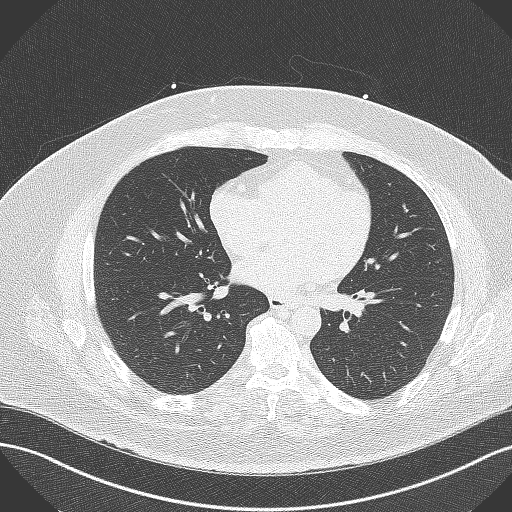
[im 51/76  lung]
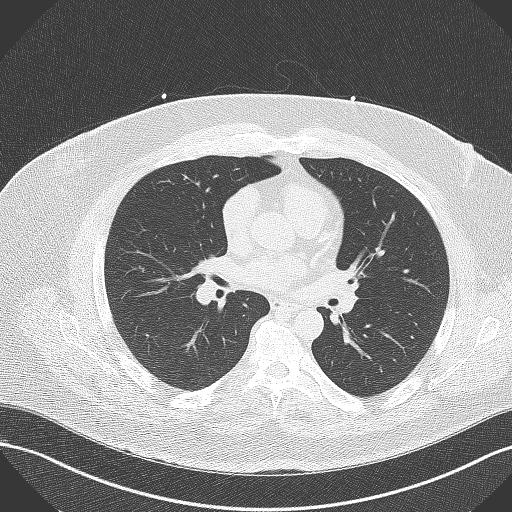
[im 63/76  lung]
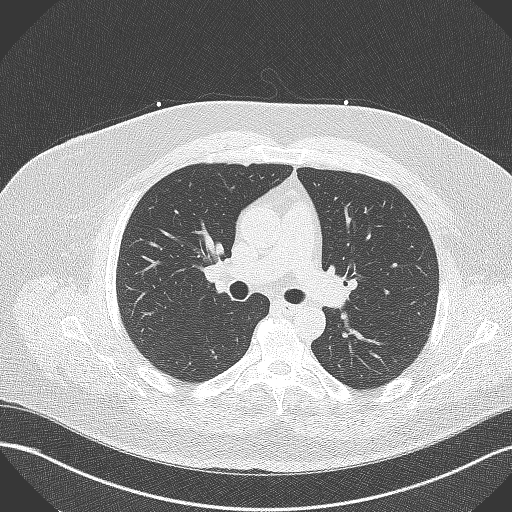

[14 of 20 positions shown; findings below may reference images not displayed]

FINDINGS: Within the visualized portions of the thorax there are no suspicious
appearing pulmonary nodules or masses, there is no acute
consolidative airspace disease, no pleural effusions, no
pneumothorax and no lymphadenopathy. Visualized portions of the
upper abdomen are unremarkable. There are no aggressive appearing
lytic or blastic lesions noted in the visualized portions of the
skeleton.
IMPRESSION: 1. No significant incidental noncardiac findings are noted.
FINDINGS: Coronary arteries: Normal origins.

Coronary Calcium Score:

Left main: 0

Left anterior descending artery:

Left circumflex artery:

Right coronary artery: 27

Total: 40

Percentile: 85th

Pericardium: Normal.

Ascending Aorta: Normal caliber.  3.0 cm.  Aortic atherosclerosis.

Non-cardiac: See separate report from [REDACTED].
IMPRESSION: Coronary calcium score of 40. This was 85th percentile for age-,
race-, and sex-matched controls.

Aortic atherosclerosis.



If CAC=0, it is reasonable to withhold statin therapy and reassess
in 5 to 10 years, as long as higher risk conditions are absent
(diabetes mellitus, family history of premature CHD in first degree
relatives (males <55 years; females <65 years), cigarette smoking,
or LDL >=190 mg/dL).

If CAC is 1 to 99, it is reasonable to initiate statin therapy for
patients >=55 years of age.

If CAC is >=100 or >=75th percentile, it is reasonable to initiate
statin therapy at any age.

Cardiology referral should be considered for patients with CAC
scores >=400 or >=75th percentile.

*7098 AHA/ACC/AACVPR/AAPA/ABC/UHL/DELACRUZ/CARLOS PINTO/[place]/DOMINIKUS/CALLMYNAME/POLIN
Guideline on the Management of Blood Cholesterol: A Report of the
American College of Cardiology/American Heart Association Task Force
on Clinical Practice Guidelines. J Am Coll Cardiol.
2722;73(24):1868-1985.

*** End of Addendum ***
EXAM:
OVER-READ INTERPRETATION  CT CHEST

The following report is an over-read performed by radiologist Dr.
Jalver Saa [REDACTED] on 09/03/2021. This
over-read does not include interpretation of cardiac or coronary
anatomy or pathology. The coronary calcium score interpretation by
the cardiologist is attached.
FINDINGS: Within the visualized portions of the thorax there are no suspicious
appearing pulmonary nodules or masses, there is no acute
consolidative airspace disease, no pleural effusions, no
pneumothorax and no lymphadenopathy. Visualized portions of the
upper abdomen are unremarkable. There are no aggressive appearing
lytic or blastic lesions noted in the visualized portions of the
skeleton.
IMPRESSION: 1. No significant incidental noncardiac findings are noted.

## 2023-07-28 ENCOUNTER — Encounter: Payer: Self-pay | Admitting: Medical

## 2023-07-28 ENCOUNTER — Other Ambulatory Visit: Payer: Self-pay

## 2023-07-28 ENCOUNTER — Other Ambulatory Visit (HOSPITAL_COMMUNITY): Payer: Self-pay

## 2023-07-28 ENCOUNTER — Ambulatory Visit: Payer: Commercial Managed Care - PPO | Admitting: Medical

## 2023-07-28 VITALS — BP 120/76 | HR 80 | Ht 71.0 in | Wt 295.2 lb

## 2023-07-28 DIAGNOSIS — Z23 Encounter for immunization: Secondary | ICD-10-CM

## 2023-07-28 DIAGNOSIS — I10 Essential (primary) hypertension: Secondary | ICD-10-CM

## 2023-07-28 DIAGNOSIS — H029 Unspecified disorder of eyelid: Secondary | ICD-10-CM

## 2023-07-28 DIAGNOSIS — Z125 Encounter for screening for malignant neoplasm of prostate: Secondary | ICD-10-CM

## 2023-07-28 DIAGNOSIS — L989 Disorder of the skin and subcutaneous tissue, unspecified: Secondary | ICD-10-CM | POA: Diagnosis not present

## 2023-07-28 DIAGNOSIS — G40909 Epilepsy, unspecified, not intractable, without status epilepticus: Secondary | ICD-10-CM | POA: Diagnosis not present

## 2023-07-28 DIAGNOSIS — Z0000 Encounter for general adult medical examination without abnormal findings: Secondary | ICD-10-CM | POA: Diagnosis not present

## 2023-07-28 DIAGNOSIS — Z131 Encounter for screening for diabetes mellitus: Secondary | ICD-10-CM

## 2023-07-28 DIAGNOSIS — Z1211 Encounter for screening for malignant neoplasm of colon: Secondary | ICD-10-CM | POA: Diagnosis not present

## 2023-07-28 DIAGNOSIS — I700 Atherosclerosis of aorta: Secondary | ICD-10-CM | POA: Diagnosis not present

## 2023-07-28 DIAGNOSIS — E782 Mixed hyperlipidemia: Secondary | ICD-10-CM | POA: Diagnosis not present

## 2023-07-28 LAB — POCT URINALYSIS DIP (PROADVANTAGE DEVICE)
Bilirubin, UA: NEGATIVE
Blood, UA: NEGATIVE
Glucose, UA: NEGATIVE mg/dL
Ketones, POC UA: NEGATIVE mg/dL
Leukocytes, UA: NEGATIVE
Nitrite, UA: NEGATIVE
Protein Ur, POC: NEGATIVE mg/dL
Specific Gravity, Urine: 1.02
Urobilinogen, Ur: 0.2
pH, UA: 6 (ref 5.0–8.0)

## 2023-07-28 LAB — LIPID PANEL

## 2023-07-28 MED ORDER — HYDROCHLOROTHIAZIDE 12.5 MG PO CAPS
12.5000 mg | ORAL_CAPSULE | Freq: Every day | ORAL | 2 refills | Status: DC
  Filled 2023-07-28 – 2023-08-30 (×2): qty 90, 90d supply, fill #0
  Filled 2023-11-27: qty 90, 90d supply, fill #1
  Filled 2024-02-23: qty 90, 90d supply, fill #2

## 2023-07-28 MED ORDER — ROSUVASTATIN CALCIUM 40 MG PO TABS
40.0000 mg | ORAL_TABLET | Freq: Every day | ORAL | 2 refills | Status: DC
  Filled 2023-07-28: qty 90, 90d supply, fill #0
  Filled 2023-10-27: qty 90, 90d supply, fill #1
  Filled 2024-01-21: qty 90, 90d supply, fill #2

## 2023-07-28 MED ORDER — LISINOPRIL 20 MG PO TABS
20.0000 mg | ORAL_TABLET | Freq: Two times a day (BID) | ORAL | 2 refills | Status: DC
  Filled 2023-07-28: qty 180, 90d supply, fill #0
  Filled 2023-11-01: qty 180, 90d supply, fill #1
  Filled 2024-01-28: qty 180, 90d supply, fill #2

## 2023-07-28 NOTE — Progress Notes (Signed)
 Subjective:   HPI  Cristian Ortiz. is a 58 y.o. male who presents for Chief Complaint  Patient presents with   Annual Exam    Fasting annual exam, no new concerns.     Patient Care Team: Barack Nicodemus, Alm GORMAN RIGGERS as PCP - General (Family Medicine) Georjean Darice HERO, MD as Consulting Physician (Neurology) Sees dentist Sees eye doctor Dr. Vina Gull, cardiology   Concerns: Doing well overall.  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  He would like a COVID shot today  Compliant with medications in general.  He monitors his blood pressure at home and it has been normal  He plans to go to [facility] in 2025 as his wife is a marathon there  Reviewed their medical, surgical, family, social, medication, and allergy history and updated chart as appropriate.  Past Medical History:  Diagnosis Date   Hyperlipidemia    Hypertension    Seizures (HCC)     Past Surgical History:  Procedure Laterality Date   KNEE SURGERY Right    arthroscopic, remote past    Family History  Problem Relation Age of Onset   Hypertension Mother    Heart disease Father    Congestive Heart Failure Father    Cancer Father        pancreatic   Diabetes Father    Cataracts Father    Hypertension Sister    Hypertension Brother    Hypertension Brother    Congestive Heart Failure Brother      Current Outpatient Medications:    Ascorbic Acid (VITAMIN C) 1000 MG tablet, Take 1,000 mg by mouth daily., Disp: , Rfl:    cholecalciferol (VITAMIN D3) 25 MCG (1000 UNIT) tablet, Take 1,000 Units by mouth daily., Disp: , Rfl:    folic acid (FOLVITE) 1 MG tablet, Take by mouth., Disp: , Rfl:    Multiple Vitamins-Minerals (MULTIVITAMIN WITH MINERALS) tablet, Take 1 tablet by mouth daily., Disp: , Rfl:    phenytoin   (DILANTIN ) 100 MG ER capsule, Take 2 capsules (200 mg total) by mouth 2 (two) times daily., Disp: 360 capsule, Rfl: 4   topiramate  (TOPAMAX ) 100 MG tablet, Take 1 tablet (100 mg total) by mouth 2 (two) times daily, Disp: 180 tablet, Rfl: 4   hydrochlorothiazide  (MICROZIDE ) 12.5 MG capsule, Take 1 capsule (12.5 mg total) by mouth daily., Disp: 90 capsule, Rfl: 2   lisinopril  (ZESTRIL ) 20 MG tablet, Take 1 tablet (20 mg total) by mouth 2 (two) times daily., Disp: 180 tablet, Rfl: 2   LORazepam  (ATIVAN ) 2 MG tablet, Take 2 mg by mouth daily as needed for anxiety or seizure. (Patient not taking: Reported on 07/28/2023), Disp: , Rfl:    rosuvastatin  (CRESTOR ) 40 MG tablet, Take 1 tablet (40 mg total) by mouth daily. Will need an appointment for further refills., Disp: 90 tablet, Rfl: 2  No Known Allergies  Review of Systems  Constitutional:  Negative for chills, fever, malaise/fatigue and weight loss.  HENT:  Negative for congestion, ear pain, hearing loss, sore throat and tinnitus.   Eyes:  Negative for blurred vision, pain and redness.  Respiratory:  Negative for cough, hemoptysis and shortness of breath.   Cardiovascular:  Negative for chest pain, palpitations, orthopnea, claudication and leg swelling.  Gastrointestinal:  Negative for abdominal pain, blood in stool, constipation, diarrhea, nausea and vomiting.  Genitourinary:  Negative for dysuria, flank pain, frequency, hematuria and urgency.  Musculoskeletal:  Negative for falls, joint pain and myalgias.  Skin:  Positive for rash. Negative for itching.  Neurological:  Negative for dizziness, tingling, speech change, weakness and headaches.  Endo/Heme/Allergies:  Negative for polydipsia. Does not bruise/bleed easily.  Psychiatric/Behavioral:  Negative for depression and memory loss. The patient is not nervous/anxious and does not have insomnia.         07/28/2023    8:25 AM 07/22/2022    8:31 AM 10/16/2021    9:19 AM 07/17/2021   10:26 AM  11/01/2020   10:39 AM  Depression screen PHQ 2/9  Decreased Interest 0 0 0 0 0  Down, Depressed, Hopeless 0 0 0 0 0  PHQ - 2 Score 0 0 0 0 0        Objective:  BP 120/76   Pulse 80   Ht 5' 11 (1.803 m)   Wt 295 lb 3.2 oz (133.9 kg)   SpO2 97%   BMI 41.17 kg/m   BP Readings from Last 3 Encounters:  07/28/23 120/76  05/12/23 123/80  07/22/22 124/80   Wt Readings from Last 3 Encounters:  07/28/23 295 lb 3.2 oz (133.9 kg)  05/12/23 291 lb 3.2 oz (132.1 kg)  07/22/22 286 lb 12.8 oz (130.1 kg)    General appearance: alert, no distress, WD/WN, African American male Skin: fleshy skin growth of bilateral lateral eyes in lateral corners of eyelids, each growth about naproxen 8-93mm diameter round raised lesions  large area of discoloration, pink/brown of bilat intertriginous regions of right inguinal, lateral hip and right buttock area unchanged per patient HEENT: normocephalic, conjunctiva/corneas normal, sclerae anicteric, PERRLA, EOMi, nares patent, no discharge or erythema, pharynx normal Oral cavity: MMM, tongue normal Neck: supple, no lymphadenopathy, no thyromegaly, no masses, normal ROM, no bruits Chest: non tender, normal shape and expansion Heart: RRR, normal S1, S2, no murmurs Lungs: CTA bilaterally, no wheezes, rhonchi, or rales Abdomen: +bs, soft, non tender, non distended, no masses, no hepatomegaly, no splenomegaly, no bruits Back: non tender, normal ROM, no scoliosis Musculoskeletal: upper extremities non tender, no obvious deformity, normal ROM throughout, lower extremities non tender, no obvious deformity, normal ROM throughout Extremities: no edema, no cyanosis, no clubbing Pulses: 2+ symmetric, upper and lower extremities, normal cap refill Neurological: alert, oriented x 3, CN2-12 intact, strength normal upper extremities and lower extremities, sensation normal throughout, DTRs 2+ throughout, no cerebellar signs, gait normal Psychiatric: normal affect, behavior  normal, pleasant  GU: normal male external genitalia,circumcised, nontender, no masses, no hernia, no lymphadenopathy Rectal: anus somewhat reduced tone, prostate mildly enlarged   Assessment and Plan :   Encounter Diagnoses  Name Primary?   Encounter for health maintenance examination in adult Yes   Aortic atherosclerosis (HCC)    Seizure disorder (HCC)    Mixed hyperlipidemia    Primary hypertension    Need for COVID-19 vaccine    Screening for diabetes mellitus    Screening for prostate cancer    Screen for colon cancer    Skin abnormality    Eyelid abnormality    Essential hypertension     This visit was a preventative care visit, also known as wellness visit or routine physical.      Recommendations: Continue to return yearly for your annual wellness and preventative care visits.  This gives us  a chance to discuss  healthy lifestyle, exercise, vaccinations, review your chart record, and perform screenings where appropriate.  I recommend you see your eye doctor yearly for routine vision care.  I recommend you see your dentist yearly for routine dental care including hygiene visits twice yearly.   Vaccination recommendations were reviewed Immunization History  Administered Date(s) Administered   Influenza Split 04/13/2021   Influenza,inj,Quad PF,6+ Mos 05/24/2018, 04/01/2019, 04/18/2020   Influenza-Unspecified 04/11/2022, 04/05/2023   Janssen (J&J) SARS-COV-2 Vaccination 02/14/2020   PFIZER(Purple Top)SARS-COV-2 Vaccination 07/31/2020   Pfizer Covid-19 Vaccine Bivalent Booster 34yrs & up 07/17/2021   Pfizer(Comirnaty)Fall Seasonal Vaccine 12 years and older 07/28/2023   Tdap 02/27/2016   Zoster Recombinant(Shingrix ) 07/22/2022, 10/21/2022   Counseled on the Covid virus vaccine.  Vaccine information sheet given.  Covid vaccine given after consent obtained.   Screening for cancer: Colon cancer screening: Cologuard negative 10/2019.   Updated referral sent for  Cologuard  We discussed PSA, prostate exam, and prostate cancer screening risks/benefits.     Skin cancer screening: Check your skin regularly for new changes, growing lesions, or other lesions of concern Come in for evaluation if you have skin lesions of concern.  Lung cancer screening: If you have a greater than 20 pack year history of tobacco use, then you may qualify for lung cancer screening with a chest CT scan.   Please call your insurance company to inquire about coverage for this test.  We currently don't have screenings for other cancers besides breast, cervical, colon, and lung cancers.  If you have a strong family history of cancer or have other cancer screening concerns, please let me know.    Bone health: Get at least 150 minutes of aerobic exercise weekly Get weight bearing exercise at least once weekly Bone density test:  A bone density test is an imaging test that uses a type of X-ray to measure the amount of calcium  and other minerals in your bones. The test may be used to diagnose or screen you for a condition that causes weak or thin bones (osteoporosis), predict your risk for a broken bone (fracture), or determine how well your osteoporosis treatment is working. The bone density test is recommended for females 65 and older, or females or males <65 if certain risk factors such as thyroid disease, long term use of steroids such as for asthma or rheumatological issues, vitamin D deficiency, estrogen deficiency, family history of osteoporosis, self or family history of fragility fracture in first degree relative.  Normal bone density 2023.   Heart health: Get at least 150 minutes of aerobic exercise weekly Limit alcohol It is important to maintain a healthy blood pressure and healthy cholesterol numbers  Heart disease screening: Screening for heart disease includes screening for blood pressure, fasting lipids, glucose/diabetes screening, BMI height to weight ratio,  reviewed of smoking status, physical activity, and diet.    Goals include blood pressure 120/80 or less, maintaining a healthy lipid/cholesterol profile, preventing diabetes or keeping diabetes numbers under good control, not smoking or using tobacco products, exercising most days per week or at least 150 minutes per week of exercise, and eating healthy variety of fruits and vegetables, healthy oils, and avoiding unhealthy food choices like fried food, fast food, high sugar and high cholesterol foods.     CT cardiac score 09/03/21 IMPRESSION: Coronary calcium  score of 40. This was 85th percentile for age-, race-, and sex-matched controls.   Aortic atherosclerosis.   Echocardiogram 06/25/2021  IMPRESSIONS   1. Left ventricular ejection fraction, by  estimation, is 60 to 65%. The  left ventricle has normal function. The left ventricle has no regional  wall motion abnormalities. Left ventricular diastolic parameters were  normal. The average left ventricular  global longitudinal strain is -24.2 %. The global longitudinal strain is  normal.   2. Right ventricular systolic function is normal. The right ventricular  size is normal.   3. The mitral valve is normal in structure. Trivial mitral valve  regurgitation. No evidence of mitral stenosis.   4. The aortic valve is tricuspid. Aortic valve regurgitation is not  visualized. No aortic stenosis is present.   5. The inferior vena cava is normal in size with greater than 50%  respiratory variability, suggesting right atrial pressure of 3 mmHg.   Comparison(s): No prior Echocardiogram.     Medical care options: I recommend you continue to seek care here first for routine care.  We try really hard to have available appointments Monday through Friday daytime hours for sick visits, acute visits, and physicals.  Urgent care should be used for after hours and weekends for significant issues that cannot wait till the next day.  The emergency  department should be used for significant potentially life-threatening emergencies.  The emergency department is expensive, can often have long wait times for less significant concerns, so try to utilize primary care, urgent care, or telemedicine when possible to avoid unnecessary trips to the emergency department.  Virtual visits and telemedicine have been introduced since the pandemic started in 2020, and can be convenient ways to receive medical care.  We offer virtual appointments as well to assist you in a variety of options to seek medical care.   Advanced Directives: I recommend you consider completing a Health Care Power of Attorney and Living Will.   These documents respect your wishes and help alleviate burdens on your loved ones if you were to become terminally ill or be in a position to need those documents enforced.    You can complete Advanced Directives yourself, have them notarized, then have copies made for our office, for you and for anybody you feel should have them in safe keeping.  Or, you can have an attorney prepare these documents.   If you haven't updated your Last Will and Testament in a while, it may be worthwhile having an attorney prepare these documents together and save on some costs.       Separate significant issues discussed: Hypertension - continue current medications, Lisinopril  20mg  daily and hydrochlorothiazide  12.5mg  daily  Hyperlipidemia - continue statin Crestor  20mg  daily  Obesity - continue efforts to lose weight through health diet and exercise  Seizure disorder - managed by neurology, compliant with medication  Skin abnormality - referral to dermatology  Eyelid lesions bilat - referral to ophthalmology    Markeise was seen today for annual exam.  Diagnoses and all orders for this visit:  Encounter for health maintenance examination in adult -     Comprehensive metabolic panel -     CBC -     Hemoglobin A1c -     Lipid panel -     PSA -      POCT Urinalysis DIP (Proadvantage Device) -     Cologuard -     Ambulatory referral to Ophthalmology  Aortic atherosclerosis (HCC)  Seizure disorder (HCC)  Mixed hyperlipidemia -     Lipid panel  Primary hypertension  Need for COVID-19 vaccine -     Pfizer Comirnaty Covid -19 Vaccine 70yrs and  older  Screening for diabetes mellitus -     Hemoglobin A1c  Screening for prostate cancer -     PSA  Screen for colon cancer -     Cologuard  Skin abnormality -     Ambulatory referral to Dermatology  Eyelid abnormality  Essential hypertension -     hydrochlorothiazide  (MICROZIDE ) 12.5 MG capsule; Take 1 capsule (12.5 mg total) by mouth daily. -     lisinopril  (ZESTRIL ) 20 MG tablet; Take 1 tablet (20 mg total) by mouth 2 (two) times daily.  Other orders -     rosuvastatin  (CRESTOR ) 40 MG tablet; Take 1 tablet (40 mg total) by mouth daily. Will need an appointment for further refills.    Follow-up pending labs, yearly for physical

## 2023-07-29 LAB — COMPREHENSIVE METABOLIC PANEL
ALT: 23 [IU]/L (ref 0–44)
AST: 23 [IU]/L (ref 0–40)
Albumin: 4.3 g/dL (ref 3.8–4.9)
Alkaline Phosphatase: 104 [IU]/L (ref 44–121)
BUN/Creatinine Ratio: 16 (ref 9–20)
BUN: 14 mg/dL (ref 6–24)
Bilirubin Total: 0.3 mg/dL (ref 0.0–1.2)
CO2: 21 mmol/L (ref 20–29)
Calcium: 9.1 mg/dL (ref 8.7–10.2)
Chloride: 101 mmol/L (ref 96–106)
Creatinine, Ser: 0.88 mg/dL (ref 0.76–1.27)
Globulin, Total: 2.9 g/dL (ref 1.5–4.5)
Glucose: 100 mg/dL — ABNORMAL HIGH (ref 70–99)
Potassium: 3.9 mmol/L (ref 3.5–5.2)
Sodium: 143 mmol/L (ref 134–144)
Total Protein: 7.2 g/dL (ref 6.0–8.5)
eGFR: 102 mL/min/{1.73_m2} (ref >59)

## 2023-07-29 LAB — CBC
Hematocrit: 42.9 % (ref 37.5–51.0)
Hemoglobin: 14.5 g/dL (ref 13.0–17.7)
MCH: 29.7 pg (ref 26.6–33.0)
MCHC: 33.8 g/dL (ref 31.5–35.7)
MCV: 88 fL (ref 79–97)
Platelets: 355 10*3/uL (ref 150–450)
RBC: 4.88 x10E6/uL (ref 4.14–5.80)
RDW: 13 % (ref 11.6–15.4)
WBC: 9.4 10*3/uL (ref 3.4–10.8)

## 2023-07-29 LAB — LIPID PANEL
Cholesterol, Total: 208 mg/dL — ABNORMAL HIGH (ref 100–199)
HDL: 46 mg/dL (ref >39)
LDL CALC COMMENT:: 4.5 ratio (ref 0.0–5.0)
LDL Chol Calc (NIH): 154 mg/dL — ABNORMAL HIGH (ref 0–99)
Triglycerides: 47 mg/dL (ref 0–149)
VLDL Cholesterol Cal: 8 mg/dL (ref 5–40)

## 2023-07-29 LAB — HEMOGLOBIN A1C
Est. average glucose Bld gHb Est-mCnc: 126 mg/dL
Hgb A1c MFr Bld: 6 % — ABNORMAL HIGH (ref 4.8–5.6)

## 2023-07-29 LAB — PSA: Prostate Specific Ag, Serum: 0.6 ng/mL (ref 0.0–4.0)

## 2023-07-29 NOTE — Progress Notes (Signed)
 Results sent through MyChart

## 2023-08-03 ENCOUNTER — Other Ambulatory Visit (HOSPITAL_COMMUNITY): Payer: Self-pay

## 2023-08-03 ENCOUNTER — Other Ambulatory Visit: Payer: Self-pay

## 2023-08-08 DIAGNOSIS — Z1211 Encounter for screening for malignant neoplasm of colon: Secondary | ICD-10-CM | POA: Diagnosis not present

## 2023-08-10 ENCOUNTER — Other Ambulatory Visit: Payer: Self-pay

## 2023-08-14 LAB — COLOGUARD: COLOGUARD: NEGATIVE

## 2023-08-16 NOTE — Progress Notes (Signed)
Results sent through MyChart

## 2023-08-17 ENCOUNTER — Other Ambulatory Visit (HOSPITAL_BASED_OUTPATIENT_CLINIC_OR_DEPARTMENT_OTHER): Payer: Self-pay

## 2023-08-17 ENCOUNTER — Other Ambulatory Visit (HOSPITAL_COMMUNITY): Payer: Self-pay

## 2023-08-17 ENCOUNTER — Telehealth: Payer: Commercial Managed Care - PPO | Admitting: Emergency Medicine

## 2023-08-17 ENCOUNTER — Other Ambulatory Visit: Payer: Self-pay

## 2023-08-17 DIAGNOSIS — J069 Acute upper respiratory infection, unspecified: Secondary | ICD-10-CM | POA: Diagnosis not present

## 2023-08-17 MED ORDER — BENZONATATE 100 MG PO CAPS
100.0000 mg | ORAL_CAPSULE | Freq: Two times a day (BID) | ORAL | 0 refills | Status: DC | PRN
  Filled 2023-08-17 (×2): qty 20, 10d supply, fill #0

## 2023-08-17 MED ORDER — AZELASTINE HCL 0.1 % NA SOLN
2.0000 | Freq: Two times a day (BID) | NASAL | 0 refills | Status: DC
  Filled 2023-08-17 (×2): qty 30, 50d supply, fill #0

## 2023-08-17 NOTE — Progress Notes (Signed)
E-Visit for Upper Respiratory Infection   We are sorry you are not feeling well.  Here is how we plan to help!  Based on what you have shared with me, it looks like you may have a viral upper respiratory infection.  Upper respiratory infections are caused by a large number of viruses; however, rhinovirus is the most common cause. Covid is a possibility and I recommend you test yourself for covid at home.   Symptoms vary from person to person, with common symptoms including sore throat, cough, fatigue or lack of energy and feeling of general discomfort.  A low-grade fever of up to 100.4 may present, but is often uncommon.  Symptoms vary however, and are closely related to a person's age or underlying illnesses.  The most common symptoms associated with an upper respiratory infection are nasal discharge or congestion, cough, sneezing, headache and pressure in the ears and face.  These symptoms usually persist for about 3 to 10 days, but can last up to 2 weeks.  It is important to know that upper respiratory infections do not cause serious illness or complications in most cases.    Antibiotics are not recommended by the Infectious Disease Society of [place] unless you have severe symptoms (including high fever) or you have symptoms for more than 10 days. If you still have symptoms after 10 days, antibiotics should be considered.    Upper respiratory infections can be transmitted from person to person, with the most common method of transmission being a person's hands.  The virus is able to live on the skin and can infect other persons for up to 2 hours after direct contact.  Also, these can be transmitted when someone coughs or sneezes; thus, it is important to cover the mouth to reduce this risk.  To keep the spread of the illness at bay, good hand hygiene is very important.  This is an infection that is most likely caused by a virus. There are no specific treatments other than to help you with the  symptoms until the infection runs its course.  We are sorry you are not feeling well.  Here is how we plan to help!   For nasal congestion, you may use an oral decongestants such as Mucinex D or if you have glaucoma or high blood pressure use plain Mucinex.  Saline nasal spray or nasal drops can help and can safely be used as often as needed for congestion.  For your congestion, I have prescribed Azelastine nasal spray two sprays in each nostril twice a day  If you do not have a history of heart disease, hypertension, diabetes or thyroid disease, prostate/bladder issues or glaucoma, you may also use Sudafed to treat nasal congestion.  It is highly recommended that you consult with a pharmacist or your primary care physician to ensure this medication is safe for you to take.     If you have a cough, you may use cough suppressants such as Delsym and Robitussin.  If you have glaucoma or high blood pressure, you can also use Coricidin HBP.   For cough I have prescribed for you A prescription cough medication called Tessalon Perles 100 mg. You may take 1-2 capsules every 8 hours as needed for cough  If you have a sore or scratchy throat, use a saltwater gargle-  to  teaspoon of salt dissolved in a 4-ounce to 8-ounce glass of warm water.  Gargle the solution for approximately 15-30 seconds and then spit.  It is  important not to swallow the solution.  You can also use throat lozenges/cough drops and Chloraseptic spray to help with throat pain or discomfort.  Warm or cold liquids can also be helpful in relieving throat pain.  For headache, pain or general discomfort, you can use Ibuprofen or Tylenol as directed.   Some authorities believe that zinc sprays or the use of Echinacea may shorten the course of your symptoms.   HOME CARE Only take medications as instructed by your medical team. Be sure to drink plenty of fluids. Water is fine as well as fruit juices, sodas and electrolyte beverages. You may  want to stay away from caffeine or alcohol. If you are nauseated, try taking small sips of liquids. How do you know if you are getting enough fluid? Your urine should be a pale yellow or almost colorless. Get rest. Taking a steamy shower or using a humidifier may help nasal congestion and ease sore throat pain. You can place a towel over your head and breathe in the steam from hot water coming from a faucet. Using a saline nasal spray works much the same way. Cough drops, hard candies and sore throat lozenges may ease your cough. Avoid close contacts especially the very young and the elderly Cover your mouth if you cough or sneeze Always remember to wash your hands.   GET HELP RIGHT AWAY IF: You develop worsening fever. If your symptoms do not improve within 10 days You develop yellow or green discharge from your nose over 3 days. You have coughing fits You develop a severe head ache or visual changes. You develop shortness of breath, difficulty breathing or start having chest pain Your symptoms persist after you have completed your treatment plan  MAKE SURE YOU  Understand these instructions. Will watch your condition. Will get help right away if you are not doing well or get worse.  Thank you for choosing an e-visit.  Your e-visit answers were reviewed by a board certified advanced clinical practitioner to complete your personal care plan. Depending upon the condition, your plan could have included both over the counter or prescription medications.  Please review your pharmacy choice. Make sure the pharmacy is open so you can pick up prescription now. If there is a problem, you may contact your provider through [employer] and have the prescription routed to another pharmacy.  Your safety is important to [place]. If you have drug allergies check your prescription carefully.   For the next 24 hours you can use MyChart to ask questions about today's visit, request a non-urgent call  back, or ask for a work or school excuse. You will get an email in the next two days asking about your experience. I hope that your e-visit has been valuable and will speed your recovery.  I have spent 5 minutes in review of e-visit questionnaire, review and updating patient chart, medical decision making and response to patient.   Rica Mast, PhD, FNP-BC

## 2023-08-18 ENCOUNTER — Other Ambulatory Visit: Payer: Self-pay

## 2023-08-19 ENCOUNTER — Other Ambulatory Visit: Payer: Self-pay

## 2023-08-19 ENCOUNTER — Other Ambulatory Visit (HOSPITAL_COMMUNITY): Payer: Self-pay

## 2023-08-19 DIAGNOSIS — B354 Tinea corporis: Secondary | ICD-10-CM | POA: Diagnosis not present

## 2023-08-19 DIAGNOSIS — L810 Postinflammatory hyperpigmentation: Secondary | ICD-10-CM | POA: Diagnosis not present

## 2023-08-19 MED ORDER — KETOCONAZOLE 2 % EX CREA
TOPICAL_CREAM | CUTANEOUS | 1 refills | Status: DC
  Filled 2023-08-19: qty 60, 30d supply, fill #0
  Filled 2023-09-13: qty 60, 30d supply, fill #1

## 2023-08-19 MED ORDER — TERBINAFINE HCL 250 MG PO TABS
250.0000 mg | ORAL_TABLET | Freq: Every day | ORAL | 0 refills | Status: DC
  Filled 2023-08-19: qty 30, 30d supply, fill #0

## 2023-08-30 ENCOUNTER — Other Ambulatory Visit: Payer: Self-pay

## 2023-08-31 ENCOUNTER — Other Ambulatory Visit (HOSPITAL_COMMUNITY): Payer: Self-pay

## 2023-08-31 ENCOUNTER — Other Ambulatory Visit (HOSPITAL_BASED_OUTPATIENT_CLINIC_OR_DEPARTMENT_OTHER): Payer: Self-pay

## 2023-08-31 ENCOUNTER — Other Ambulatory Visit: Payer: Self-pay

## 2023-08-31 DIAGNOSIS — D23112 Other benign neoplasm of skin of right lower eyelid, including canthus: Secondary | ICD-10-CM | POA: Diagnosis not present

## 2023-08-31 DIAGNOSIS — D23111 Other benign neoplasm of skin of right upper eyelid, including canthus: Secondary | ICD-10-CM | POA: Diagnosis not present

## 2023-08-31 DIAGNOSIS — D23121 Other benign neoplasm of skin of left upper eyelid, including canthus: Secondary | ICD-10-CM | POA: Diagnosis not present

## 2023-08-31 DIAGNOSIS — D23122 Other benign neoplasm of skin of left lower eyelid, including canthus: Secondary | ICD-10-CM | POA: Diagnosis not present

## 2023-08-31 MED ORDER — NEOMYCIN-POLYMYXIN-DEXAMETH 3.5-10000-0.1 OP OINT
TOPICAL_OINTMENT | Freq: Two times a day (BID) | OPHTHALMIC | 2 refills | Status: DC
  Filled 2023-08-31: qty 3.5, 30d supply, fill #0
  Filled 2023-08-31: qty 3.5, 3d supply, fill #0

## 2023-09-01 ENCOUNTER — Other Ambulatory Visit: Payer: Self-pay

## 2023-09-02 ENCOUNTER — Other Ambulatory Visit (HOSPITAL_COMMUNITY): Payer: Self-pay

## 2023-09-14 ENCOUNTER — Other Ambulatory Visit (HOSPITAL_COMMUNITY): Payer: Self-pay

## 2023-09-14 ENCOUNTER — Other Ambulatory Visit: Payer: Self-pay

## 2023-10-06 DIAGNOSIS — L309 Dermatitis, unspecified: Secondary | ICD-10-CM | POA: Diagnosis not present

## 2023-10-06 DIAGNOSIS — L308 Other specified dermatitis: Secondary | ICD-10-CM | POA: Diagnosis not present

## 2023-10-08 ENCOUNTER — Other Ambulatory Visit (HOSPITAL_COMMUNITY): Payer: Self-pay

## 2023-10-08 MED ORDER — TRIAMCINOLONE ACETONIDE 0.1 % EX CREA
TOPICAL_CREAM | CUTANEOUS | 1 refills | Status: DC
Start: 2023-10-08 — End: 2024-05-11
  Filled 2023-10-08: qty 454, 30d supply, fill #0

## 2023-10-09 ENCOUNTER — Other Ambulatory Visit: Payer: Self-pay

## 2023-10-15 DIAGNOSIS — H02822 Cysts of right lower eyelid: Secondary | ICD-10-CM | POA: Diagnosis not present

## 2023-10-15 DIAGNOSIS — H02825 Cysts of left lower eyelid: Secondary | ICD-10-CM | POA: Diagnosis not present

## 2023-10-20 DIAGNOSIS — L810 Postinflammatory hyperpigmentation: Secondary | ICD-10-CM | POA: Diagnosis not present

## 2023-10-20 DIAGNOSIS — L308 Other specified dermatitis: Secondary | ICD-10-CM | POA: Diagnosis not present

## 2023-10-20 DIAGNOSIS — Z4802 Encounter for removal of sutures: Secondary | ICD-10-CM | POA: Diagnosis not present

## 2023-10-27 ENCOUNTER — Other Ambulatory Visit (HOSPITAL_COMMUNITY): Payer: Self-pay

## 2023-11-02 ENCOUNTER — Other Ambulatory Visit (HOSPITAL_COMMUNITY): Payer: Self-pay

## 2023-11-06 ENCOUNTER — Other Ambulatory Visit: Payer: Self-pay

## 2023-11-27 ENCOUNTER — Other Ambulatory Visit (HOSPITAL_COMMUNITY): Payer: Self-pay

## 2024-01-21 ENCOUNTER — Other Ambulatory Visit (HOSPITAL_COMMUNITY): Payer: Self-pay

## 2024-01-28 ENCOUNTER — Other Ambulatory Visit (HOSPITAL_COMMUNITY): Payer: Self-pay

## 2024-02-02 ENCOUNTER — Other Ambulatory Visit (HOSPITAL_COMMUNITY): Payer: Self-pay

## 2024-02-23 ENCOUNTER — Other Ambulatory Visit (HOSPITAL_COMMUNITY): Payer: Self-pay

## 2024-03-20 ENCOUNTER — Telehealth: Admitting: Family

## 2024-03-20 DIAGNOSIS — R6889 Other general symptoms and signs: Secondary | ICD-10-CM | POA: Diagnosis not present

## 2024-03-20 MED ORDER — BENZONATATE 100 MG PO CAPS
100.0000 mg | ORAL_CAPSULE | Freq: Three times a day (TID) | ORAL | 0 refills | Status: DC | PRN
  Filled 2024-03-20: qty 20, 7d supply, fill #0

## 2024-03-20 MED ORDER — OSELTAMIVIR PHOSPHATE 75 MG PO CAPS
75.0000 mg | ORAL_CAPSULE | Freq: Two times a day (BID) | ORAL | 0 refills | Status: DC
  Filled 2024-03-20: qty 10, 5d supply, fill #0

## 2024-03-20 MED ORDER — FLUTICASONE PROPIONATE 50 MCG/ACT NA SUSP
2.0000 | Freq: Every day | NASAL | 6 refills | Status: DC
  Filled 2024-03-20: qty 16, 30d supply, fill #0

## 2024-03-20 NOTE — Progress Notes (Signed)
 E visit for Flu symptoms   We are sorry that you are not feeling well.  Here is how we plan to help! Based on what you have shared with me it looks like you may have a respiratory virus that may be influenza.  Influenza or "the flu" is   an infection caused by a respiratory virus. The flu virus is highly contagious and persons who did not receive their yearly flu vaccination may "catch" the flu from close contact.  We have anti-viral medications to treat the viruses that cause this infection. They are not a "cure" and only shorten the course of the infection. These prescriptions are most effective when they are given within the first 2 days of "flu" symptoms. Antiviral medication is indicated if you have a high risk of complications from the flu. You should also consider an antiviral medication if you are in close contact with someone who is at risk. These medications can help patients avoid complications from the flu but have side effects that you should know. Possible side effects from Tamiflu  or oseltamivir  include nausea, vomiting, diarrhea, dizziness, headaches, eye redness, sleep problems or other respiratory symptoms.  You should not take Tamiflu  if you have an allergy to oseltamivir  or any to the ingredients in Tamiflu .  Based upon your symptoms and potential risk factors I have prescribed Oseltamivir  (Tamiflu ).  It has been sent to your designated pharmacy.  You will take one 75 mg capsule orally twice a day for the next 5 days., I have prescribed Tessalon  Perles 100 mg. You may take 1-2 capsules every 8 hours as needed for cough, and I have prescribed Fluticasone  nasal spray 2 sprays in each nostril one time per day    You are to isolate at home until you have been fever-free for at least 24 hours without a fever-reducing medication, and symptoms have been steadily improving for 24 hours. At that time, you can end isolation but need to mask for an additional 5 days.  If you must be around  other household members who do not have symptoms, you need to make sure that both you and the family members are masking consistently with a high-quality mask.  If you note any worsening of symptoms despite treatment, please seek an in-person evaluation ASAP. If you note any significant shortness of breath or any chest pain, please seek ED evaluation. Please do not delay care!  Go to the nearest hospital ED for assessment if fever/cough/breathlessness are severe or illness seems like a threat to life.    The following symptoms may appear 2-14 days after exposure: Fever Cough Shortness of breath or difficulty breathing Chills Repeated shaking with chills Muscle pain Headache Sore throat New loss of taste or smell Fatigue Congestion or runny nose Nausea or vomiting Diarrhea   For nasal congestion, you may use an oral decongestant such as Mucinex D or if you have glaucoma or high blood pressure use plain Mucinex.  Saline nasal spray or nasal drops can help and can safely be used as often as needed for congestion.   If you have a sore or scratchy throat, use a saltwater gargle-  to  teaspoon of salt dissolved in a 4-ounce to 8-ounce glass of warm water.  Gargle the solution for approximately 15-30 seconds and then spit.  It is important not to swallow the solution.  You can also use throat lozenges/cough drops and Chloraseptic spray to help with throat pain or discomfort.  Warm or cold liquids can  also be helpful in relieving throat pain.  For headache, pain or general discomfort, you can use Ibuprofen or Tylenol as directed.   Some authorities believe that zinc sprays or the use of Echinacea may shorten the course of your symptoms.  ANYONE WHO HAS FLU SYMPTOMS SHOULD:  Stay home. The flu is highly contagious and going out or to work exposes others!  Be sure to drink plenty of fluids. Water is fine as well as fruit juices, sodas and electrolyte beverages. You may want to stay        away from caffeine or alcohol. If you are nauseated, try taking small sips of liquids. How do you know if you are getting enough fluid? Your urine should be a pale yellow or almost colorless.  Get rest.  Taking a steamy shower or using a humidifier may help nasal congestion and ease sore throat pain. Using a saline nasal spray works much the same way.  Cough drops, hard candies and sore throat lozenges may ease your cough.  Line up a caregiver. Have someone check on you regularly.  GET HELP RIGHT AWAY IF YOU HAVE EMERGENCY WARNING SIGNS - CALL 911 or proceed to your closest emergency facility if:  You develop worsening high fever. Trouble breathing Bluish lips or face Persistent pain or pressure in the chest New confusion Inability to wake or stay awake You cough up blood. Your symptoms become more severe Inability to hold down food or fluids  MAKE SURE YOU Understand these instructions. Will watch your condition. Will get help right away if you are not doing well or get worse.  Your e-visit answers were reviewed by a board certified advanced clinical practitioner to complete your personal care plan.  Depending on the condition, your plan could have included both over the counter or prescription medications. If there is a problem, please reply once you have received a response from your provider. Your safety is important to us .  If you have drug allergies, check your prescription carefully.   You can use MyChart to ask questions about today's visit, request a non-urgent call back, or ask for a work or school excuse for 24 hours related to this e-Visit. If it has been greater than 24 hours you will need to follow up with your provider or enter a new e-Visit to address those concerns. You will get an e-mail in the next two days asking about your experience.  I hope that your e-visit has been valuable and will speed up your recovery. Thank you for using E-visits!

## 2024-03-20 NOTE — Addendum Note (Signed)
 Addended by: LAVELL LYE A on: 03/20/2024 01:18 PM   Modules accepted: Orders

## 2024-03-20 NOTE — Progress Notes (Signed)
 Hello, Can you please take a home COVID test to rule out so we can treat you appropriately. Please reply back with your test results.    Bari Learn, FNP  Approximately 5 minutes was spent documenting and reviewing patient's chart.

## 2024-03-21 ENCOUNTER — Other Ambulatory Visit: Payer: Self-pay

## 2024-03-22 ENCOUNTER — Other Ambulatory Visit (HOSPITAL_COMMUNITY): Payer: Self-pay

## 2024-04-19 ENCOUNTER — Other Ambulatory Visit: Payer: Self-pay | Admitting: Medical

## 2024-04-20 ENCOUNTER — Other Ambulatory Visit (HOSPITAL_COMMUNITY): Payer: Self-pay

## 2024-04-20 ENCOUNTER — Other Ambulatory Visit: Payer: Self-pay

## 2024-04-20 MED ORDER — ROSUVASTATIN CALCIUM 40 MG PO TABS
40.0000 mg | ORAL_TABLET | Freq: Every day | ORAL | 2 refills | Status: AC
  Filled 2024-04-20: qty 90, 90d supply, fill #0
  Filled 2024-07-18: qty 90, 90d supply, fill #1

## 2024-04-20 NOTE — Telephone Encounter (Signed)
 Last CPE was 07/2023 does he need to be seen again before then or ok to refill?

## 2024-04-27 ENCOUNTER — Other Ambulatory Visit (HOSPITAL_COMMUNITY): Payer: Self-pay

## 2024-04-27 ENCOUNTER — Other Ambulatory Visit: Payer: Self-pay

## 2024-04-27 ENCOUNTER — Other Ambulatory Visit: Payer: Self-pay | Admitting: Medical

## 2024-04-27 DIAGNOSIS — I10 Essential (primary) hypertension: Secondary | ICD-10-CM

## 2024-04-27 MED ORDER — LISINOPRIL 20 MG PO TABS
20.0000 mg | ORAL_TABLET | Freq: Two times a day (BID) | ORAL | 0 refills | Status: DC
  Filled 2024-04-27: qty 180, 90d supply, fill #0

## 2024-04-30 ENCOUNTER — Other Ambulatory Visit (HOSPITAL_COMMUNITY): Payer: Self-pay

## 2024-05-11 ENCOUNTER — Other Ambulatory Visit (HOSPITAL_COMMUNITY): Payer: Self-pay

## 2024-05-11 ENCOUNTER — Ambulatory Visit: Payer: 59 | Admitting: Neurology

## 2024-05-11 ENCOUNTER — Encounter: Payer: Self-pay | Admitting: Neurology

## 2024-05-11 ENCOUNTER — Other Ambulatory Visit: Payer: Self-pay

## 2024-05-11 VITALS — BP 124/76 | HR 82 | Ht 72.0 in | Wt 297.8 lb

## 2024-05-11 DIAGNOSIS — Z79899 Other long term (current) drug therapy: Secondary | ICD-10-CM | POA: Diagnosis not present

## 2024-05-11 DIAGNOSIS — G40909 Epilepsy, unspecified, not intractable, without status epilepticus: Secondary | ICD-10-CM | POA: Diagnosis not present

## 2024-05-11 MED ORDER — PHENYTOIN SODIUM EXTENDED 100 MG PO CAPS
200.0000 mg | ORAL_CAPSULE | Freq: Two times a day (BID) | ORAL | 4 refills | Status: AC
  Filled 2024-05-11 – 2024-08-02 (×2): qty 360, 90d supply, fill #0

## 2024-05-11 MED ORDER — LORAZEPAM 2 MG PO TABS
2.0000 mg | ORAL_TABLET | Freq: Every day | ORAL | 3 refills | Status: AC | PRN
  Filled 2024-05-11: qty 30, 30d supply, fill #0

## 2024-05-11 MED ORDER — TOPIRAMATE 100 MG PO TABS
100.0000 mg | ORAL_TABLET | Freq: Two times a day (BID) | ORAL | 4 refills | Status: AC
  Filled 2024-05-11 – 2024-08-02 (×2): qty 180, 90d supply, fill #0

## 2024-05-11 NOTE — Progress Notes (Signed)
 NEUROLOGY FOLLOW UP OFFICE NOTE  Cristian Ortiz [id number] 1966/04/18  HISTORY OF PRESENT ILLNESS: I had the pleasure of seeing Cristian Ortiz. in follow-up in the neurology clinic on 05/11/2024.  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 He is alone in the office today. Records and images were personally reviewed where available. He denies any seizures since 2019 (due to missed medication). He denies any staring/unresponsive episodes, gaps in time, olfactory/gustatory hallucinations, focal numbness/tingling/weakness, myoclonic jerks. He is on Dilantin  200mg  BID (100mg : 2 caps BID) and Topiramate  100mg  BID without side effects. No headaches, dizziness, vision changes, no falls. He gets 7-8 hours of sleep. Mood is good, it has been a difficult year, his mother passed away last 01-05-24.    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 Records from his neurologist Dr. Maree were reviewed. He was last seen by Dr. Maree in 02/2021. Per notes, first seizure occurred in 2000. He was started on Dilantin  and went seizure-free for a year until he had a seizure and dislocated his right shoulder. There is note of a history of status epilepticus in 2004. He reports his seizures are exclusively nocturnal. Notes indicate that his wife has reported he would be staring, unresponsive with lip smacking/chewing movements, trying to get up. He would be confused for 1-2 hours after, agitated. He has had bowel incontinence and tongue bite with the seizures, no focal weakness. There is a note of generalized convulsions as well. They usually occur 30-60 minutes after sleep. Per notes, MRI and EEG in 2004 were done, results unavailable for review. His last seizure was in 09/2017 in the setting of missed medication. He  has been on Topiramate  100mg  BID and Phenytoin  200mg  BID for many years without side effects. His wife has Ativan by her bedside to administer, but has not needed it as he has been doing well. He denies any olfactory/gustatory hallucinations, deja vu, rising epigastric sensation, focal numbness/tingling/weakness, myoclonic jerks. He denies any headaches, dizziness, vision changes, neck/back pain, bowel/bladder dysfunction.He has recently switched his work schedule and this is helping with his sleep, mood, and memory. He is compliant with medications and has a pillbox. No falls.   Epilepsy Risk Factors:  He had a normal birth and early development.  There is no history of febrile convulsions, CNS infections such as meningitis/encephalitis, significant traumatic brain injury, neurosurgical procedures, or family history of seizures.  Laboratory Data:  02/2021:  Dilantin  level 8 Vitamin D normal 37.7 Bone density scan 01/2022 normal  Diagnostic Data: No prior EEG or MRI available for review  PAST MEDICAL HISTORY: Past Medical History:  Diagnosis Date   Hyperlipidemia    Hypertension    Seizures (HCC)     MEDICATIONS: Current Outpatient Medications on File Prior to Visit  Medication Sig Dispense Refill   Ascorbic Acid (VITAMIN C) 1000 MG tablet Take 1,000 mg by mouth daily.     cholecalciferol (VITAMIN D3) 25 MCG (1000 UNIT) tablet Take 1,000 Units by mouth daily.     folic acid (FOLVITE) 1 MG tablet Take by mouth.     hydrochlorothiazide  (MICROZIDE ) 12.5 MG capsule Take 1 capsule (12.5 mg total) by mouth daily. 90 capsule 2   lisinopril  (ZESTRIL ) 20 MG tablet Take 1 tablet (20 mg total) by mouth 2 (two) times daily. 180 tablet 0  LORazepam (ATIVAN) 2 MG tablet Take 2 mg by mouth daily as needed for anxiety or seizure.     Multiple Vitamins-Minerals (MULTIVITAMIN WITH MINERALS) tablet Take 1 tablet by mouth daily.     phenytoin  (DILANTIN ) 100 MG ER capsule Take 2 capsules (200 mg total) by  mouth 2 (two) times daily. 360 capsule 4   rosuvastatin  (CRESTOR ) 40 MG tablet Take 1 tablet (40 mg total) by mouth daily. Will need an appointment for further refills. 90 tablet 2   topiramate  (TOPAMAX ) 100 MG tablet Take 1 tablet (100 mg total) by mouth 2 (two) times daily 180 tablet 4   No current facility-administered medications on file prior to visit.    ALLERGIES: No Known Allergies  FAMILY HISTORY: Family History  Problem Relation Age of Onset   Hypertension Mother    Heart disease Father    Congestive Heart Failure Father    Cancer Father        pancreatic   Diabetes Father    Cataracts Father    Hypertension Sister    Hypertension Brother    Hypertension Brother    Congestive Heart Failure Brother     SOCIAL HISTORY: Social History   Socioeconomic History   Marital status: Married    Spouse name: Not on file   Number of children: Not on file   Years of education: Not on file   Highest education level: Not on file  Occupational History   Not on file  Tobacco Use   Smoking status: Never   Smokeless tobacco: Never  Vaping Use   Vaping status: Never Used  Substance and Sexual Activity   Alcohol use: No   Drug use: No   Sexual activity: Yes    Partners: Female  Other Topics Concern   Not on file  Social History Narrative   Right handed    Works as Education officer, community with Physicist, 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   Married   No children.     Exercise - walking   07/2023   Social Drivers of Health   Financial Resource Strain: Not on file  Food Insecurity: No Food Insecurity (07/28/2023)   Hunger Vital Sign    Worried About Running Out of Food in the Last Year: Never true    Ran Out of Food in the Last Year: Never true  Transportation Needs: No Transportation Needs (07/28/2023)   PRAPARE - Administrator, Civil Service (Medical): No    Lack of Transportation (Non-Medical): No  Physical Activity: Insufficiently Active  (07/28/2023)   Exercise Vital Sign    Days of Exercise per Week: 1 day    Minutes of Exercise per Session: 30 min  Stress: No Stress Concern Present (07/28/2023)   [employer] of Occupational Health - Occupational Stress Questionnaire    Feeling of Stress : Not at all  Social Connections: Socially Integrated (07/28/2023)   Social Connection and Isolation Panel    Frequency of Communication with Friends and Family: Three times a week    Frequency of Social Gatherings with Friends and Family: Once a week    Attends Religious Services: More than 4 times per year    Active Member of [employer] or Organizations: Yes    Attends Banker Meetings: More than 4 times per year    Marital Status: Married  Catering manager Violence: Not At Risk (07/28/2023)   Humiliation, Afraid, Rape, and Kick questionnaire    Fear of  Current or Ex-Partner: No    Emotionally Abused: No    Physically Abused: No    Sexually Abused: No     PHYSICAL EXAM: Vitals:   05/11/24 0815  BP: 124/76  Pulse: 82  SpO2: 98%   General: No acute distress Head:  Normocephalic/atraumatic Skin/Extremities: No rash, no edema Neurological Exam: alert and awake. No aphasia or dysarthria. Fund of knowledge is appropriate. Attention and concentration are normal.   Cranial nerves: Pupils equal, round. Extraocular movements intact with no nystagmus. Visual fields full.  No facial asymmetry.  Motor: Bulk and tone normal, muscle strength 5/5 throughout with no pronator drift.   Finger to nose testing intact.  Gait narrow-based and steady, able to tandem walk adequately.  Romberg negative.   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 He has been seizure-free since 09/2017 (due to missed medication), continue Topiramate  100mg  BID and Phenytoin  200mg  BID, refills sent. Continue to monitor vitamin D level with PCP. We again discussed  monitoring bone health while on long-term Phenytoin  use, bone density scan will be ordered. He is aware of [place] driving laws to stop driving after a seizure until 6 months seizure-free. Follow-up in 1 year, call for any changes.   Thank you for allowing me to participate in his care.  Please do not hesitate to call for any questions or concerns.    Darice Shivers, M.D.   CC: Alm Gent, PA-C

## 2024-05-11 NOTE — Patient Instructions (Signed)
 It's always a pleasure to see you.  Continue Topiramate  100mg  twice a day and Dilantin  200mg  twice a day  2. Schedule bone density scan  3. Please make sure vitamin D levels are monitored with PCP  4. Follow-up in 1 year, call for any changes   Seizure Precautions: 1. If medication has been prescribed for you to prevent seizures, take it exactly as directed.  Do not stop taking the medicine without talking to your doctor first, even if you have not had a seizure in a long time.   2. Avoid activities in which a seizure would cause danger to yourself or to others.  Don't operate dangerous machinery, swim alone, or climb in high or dangerous places, such as on ladders, roofs, or girders.  Do not drive unless your doctor says you may.  3. If you have any warning that you may have a seizure, lay down in a safe place where you can't hurt yourself.    4.  No driving for 6 months from last seizure, as per [place]  state law.   Please refer to the following link on the [employer]ile[employer] of [employer]'s website for more information: http://www.epilepsyfoundation.org/answerplace/Social/driving/drivingu.cfm   5.  Maintain good sleep hygiene. Avoid alcohol.  6.  Contact your doctor if you have any problems that may be related to the medicine you are taking.  7.  Call 911 and bring the patient back to the ED if:        A.  The seizure lasts longer than 5 minutes.       B.  The patient doesn't awaken shortly after the seizure  C.  The patient has new problems such as difficulty seeing, speaking or moving  D.  The patient was injured during the seizure  E.  The patient has a temperature over 102 F (39C)  F.  The patient vomited and now is having trouble breathing

## 2024-05-23 ENCOUNTER — Other Ambulatory Visit (HOSPITAL_COMMUNITY): Payer: Self-pay

## 2024-05-23 ENCOUNTER — Other Ambulatory Visit: Payer: Self-pay

## 2024-05-23 ENCOUNTER — Other Ambulatory Visit: Payer: Self-pay | Admitting: Medical

## 2024-05-23 DIAGNOSIS — I10 Essential (primary) hypertension: Secondary | ICD-10-CM

## 2024-05-23 MED ORDER — HYDROCHLOROTHIAZIDE 12.5 MG PO CAPS
12.5000 mg | ORAL_CAPSULE | Freq: Every day | ORAL | 0 refills | Status: DC
  Filled 2024-05-23: qty 90, 90d supply, fill #0

## 2024-06-07 ENCOUNTER — Encounter: Payer: Self-pay | Admitting: Neurology

## 2024-07-18 ENCOUNTER — Other Ambulatory Visit (HOSPITAL_COMMUNITY): Payer: Self-pay

## 2024-08-02 ENCOUNTER — Other Ambulatory Visit (HOSPITAL_COMMUNITY): Payer: Self-pay

## 2024-08-02 ENCOUNTER — Other Ambulatory Visit: Payer: Self-pay

## 2024-08-02 ENCOUNTER — Other Ambulatory Visit: Payer: Self-pay | Admitting: Medical

## 2024-08-02 DIAGNOSIS — I10 Essential (primary) hypertension: Secondary | ICD-10-CM

## 2024-08-02 MED ORDER — LISINOPRIL 20 MG PO TABS
20.0000 mg | ORAL_TABLET | Freq: Two times a day (BID) | ORAL | 0 refills | Status: AC
  Filled 2024-08-02: qty 180, 90d supply, fill #0

## 2024-08-16 ENCOUNTER — Ambulatory Visit: Admitting: Medical

## 2024-08-23 ENCOUNTER — Other Ambulatory Visit: Payer: Self-pay | Admitting: Medical

## 2024-08-23 ENCOUNTER — Other Ambulatory Visit: Payer: Self-pay

## 2024-08-23 DIAGNOSIS — I10 Essential (primary) hypertension: Secondary | ICD-10-CM

## 2024-08-23 MED ORDER — HYDROCHLOROTHIAZIDE 12.5 MG PO CAPS
12.5000 mg | ORAL_CAPSULE | Freq: Every day | ORAL | 0 refills | Status: AC
  Filled 2024-08-23: qty 90, 90d supply, fill #0

## 2024-09-13 ENCOUNTER — Ambulatory Visit: Admitting: Medical

## 2024-11-22 ENCOUNTER — Ambulatory Visit (HOSPITAL_BASED_OUTPATIENT_CLINIC_OR_DEPARTMENT_OTHER)

## 2025-05-10 ENCOUNTER — Ambulatory Visit: Admitting: Neurology
